# Patient Record
Sex: Male | Born: 1945 | Race: White | Hispanic: No | Marital: Married | Smoking: Never smoker
Health system: Southern US, Community
[De-identification: ages and names within clinical notes are randomized; demographics above are authoritative.]

## PROBLEM LIST (undated history)

## (undated) DIAGNOSIS — Z9289 Personal history of other medical treatment: Secondary | ICD-10-CM

## (undated) DIAGNOSIS — Z800 Family history of malignant neoplasm of digestive organs: Secondary | ICD-10-CM

## (undated) DIAGNOSIS — S329XXA Fracture of unspecified parts of lumbosacral spine and pelvis, initial encounter for closed fracture: Secondary | ICD-10-CM

## (undated) DIAGNOSIS — Z9889 Other specified postprocedural states: Secondary | ICD-10-CM

## (undated) DIAGNOSIS — I10 Essential (primary) hypertension: Secondary | ICD-10-CM

## (undated) DIAGNOSIS — S4290XA Fracture of unspecified shoulder girdle, part unspecified, initial encounter for closed fracture: Secondary | ICD-10-CM

## (undated) DIAGNOSIS — C61 Malignant neoplasm of prostate: Secondary | ICD-10-CM

## (undated) DIAGNOSIS — E786 Lipoprotein deficiency: Secondary | ICD-10-CM

## (undated) DIAGNOSIS — N509 Disorder of male genital organs, unspecified: Secondary | ICD-10-CM

## (undated) DIAGNOSIS — R972 Elevated prostate specific antigen [PSA]: Secondary | ICD-10-CM

## (undated) HISTORY — PX: PROSTATE BIOPSY: SHX241

## (undated) HISTORY — DX: Disorder of male genital organs, unspecified: N50.9

## (undated) HISTORY — DX: Family history of malignant neoplasm of digestive organs: Z80.0

## (undated) HISTORY — DX: Personal history of other medical treatment: Z92.89

## (undated) HISTORY — DX: Malignant neoplasm of prostate: C61

## (undated) HISTORY — DX: Other specified postprocedural states: Z98.890

## (undated) HISTORY — DX: Lipoprotein deficiency: E78.6

## (undated) HISTORY — PX: CARDIAC CATHETERIZATION: SHX172

## (undated) HISTORY — DX: Fracture of unspecified shoulder girdle, part unspecified, initial encounter for closed fracture: S42.90XA

## (undated) HISTORY — DX: Essential (primary) hypertension: I10

---

## 2000-05-19 DIAGNOSIS — S4290XA Fracture of unspecified shoulder girdle, part unspecified, initial encounter for closed fracture: Secondary | ICD-10-CM

## 2000-05-19 DIAGNOSIS — S329XXA Fracture of unspecified parts of lumbosacral spine and pelvis, initial encounter for closed fracture: Secondary | ICD-10-CM

## 2000-05-19 HISTORY — DX: Fracture of unspecified parts of lumbosacral spine and pelvis, initial encounter for closed fracture: S32.9XXA

## 2000-05-19 HISTORY — DX: Fracture of unspecified shoulder girdle, part unspecified, initial encounter for closed fracture: S42.90XA

## 2002-02-25 ENCOUNTER — Encounter: Payer: Self-pay | Admitting: Emergency Medicine

## 2002-02-25 ENCOUNTER — Emergency Department (HOSPITAL_COMMUNITY): Admission: EM | Admit: 2002-02-25 | Discharge: 2002-02-25 | Payer: Self-pay | Admitting: Emergency Medicine

## 2008-05-19 HISTORY — PX: MITRAL VALVE REPAIR: SHX2039

## 2009-03-22 ENCOUNTER — Ambulatory Visit (HOSPITAL_COMMUNITY): Admission: RE | Admit: 2009-03-22 | Discharge: 2009-03-22 | Payer: Self-pay | Admitting: Interventional Cardiology

## 2009-03-22 ENCOUNTER — Encounter (INDEPENDENT_AMBULATORY_CARE_PROVIDER_SITE_OTHER): Payer: Self-pay | Admitting: Interventional Cardiology

## 2009-03-23 ENCOUNTER — Ambulatory Visit: Payer: Self-pay | Admitting: Thoracic Surgery (Cardiothoracic Vascular Surgery)

## 2009-03-28 ENCOUNTER — Inpatient Hospital Stay (HOSPITAL_BASED_OUTPATIENT_CLINIC_OR_DEPARTMENT_OTHER): Admission: RE | Admit: 2009-03-28 | Discharge: 2009-03-28 | Payer: Self-pay | Admitting: Interventional Cardiology

## 2009-03-30 ENCOUNTER — Encounter: Payer: Self-pay | Admitting: Thoracic Surgery (Cardiothoracic Vascular Surgery)

## 2009-03-30 ENCOUNTER — Ambulatory Visit (HOSPITAL_COMMUNITY)
Admission: RE | Admit: 2009-03-30 | Discharge: 2009-03-30 | Payer: Self-pay | Admitting: Thoracic Surgery (Cardiothoracic Vascular Surgery)

## 2009-04-02 ENCOUNTER — Ambulatory Visit: Payer: Self-pay | Admitting: Thoracic Surgery (Cardiothoracic Vascular Surgery)

## 2009-04-05 ENCOUNTER — Ambulatory Visit: Payer: Self-pay | Admitting: Thoracic Surgery (Cardiothoracic Vascular Surgery)

## 2009-04-05 ENCOUNTER — Encounter: Payer: Self-pay | Admitting: Thoracic Surgery (Cardiothoracic Vascular Surgery)

## 2009-04-05 ENCOUNTER — Inpatient Hospital Stay (HOSPITAL_COMMUNITY)
Admission: RE | Admit: 2009-04-05 | Discharge: 2009-04-09 | Payer: Self-pay | Admitting: Thoracic Surgery (Cardiothoracic Vascular Surgery)

## 2009-04-16 ENCOUNTER — Ambulatory Visit: Payer: Self-pay | Admitting: Thoracic Surgery (Cardiothoracic Vascular Surgery)

## 2009-04-16 ENCOUNTER — Encounter
Admission: RE | Admit: 2009-04-16 | Discharge: 2009-04-16 | Payer: Self-pay | Admitting: Thoracic Surgery (Cardiothoracic Vascular Surgery)

## 2009-05-28 ENCOUNTER — Ambulatory Visit: Payer: Self-pay | Admitting: Thoracic Surgery (Cardiothoracic Vascular Surgery)

## 2009-08-20 ENCOUNTER — Ambulatory Visit: Payer: Self-pay | Admitting: Thoracic Surgery (Cardiothoracic Vascular Surgery)

## 2010-02-08 IMAGING — CR DG CHEST 1V PORT
1 series · 1 of 1 positions shown · non-contrast
Comparison: Chest 04/07/2009 at to 2651 hours.

CLINICAL DATA: Status post chest tube removal.

PORTABLE CHEST - 1 VIEW

[view not recorded]
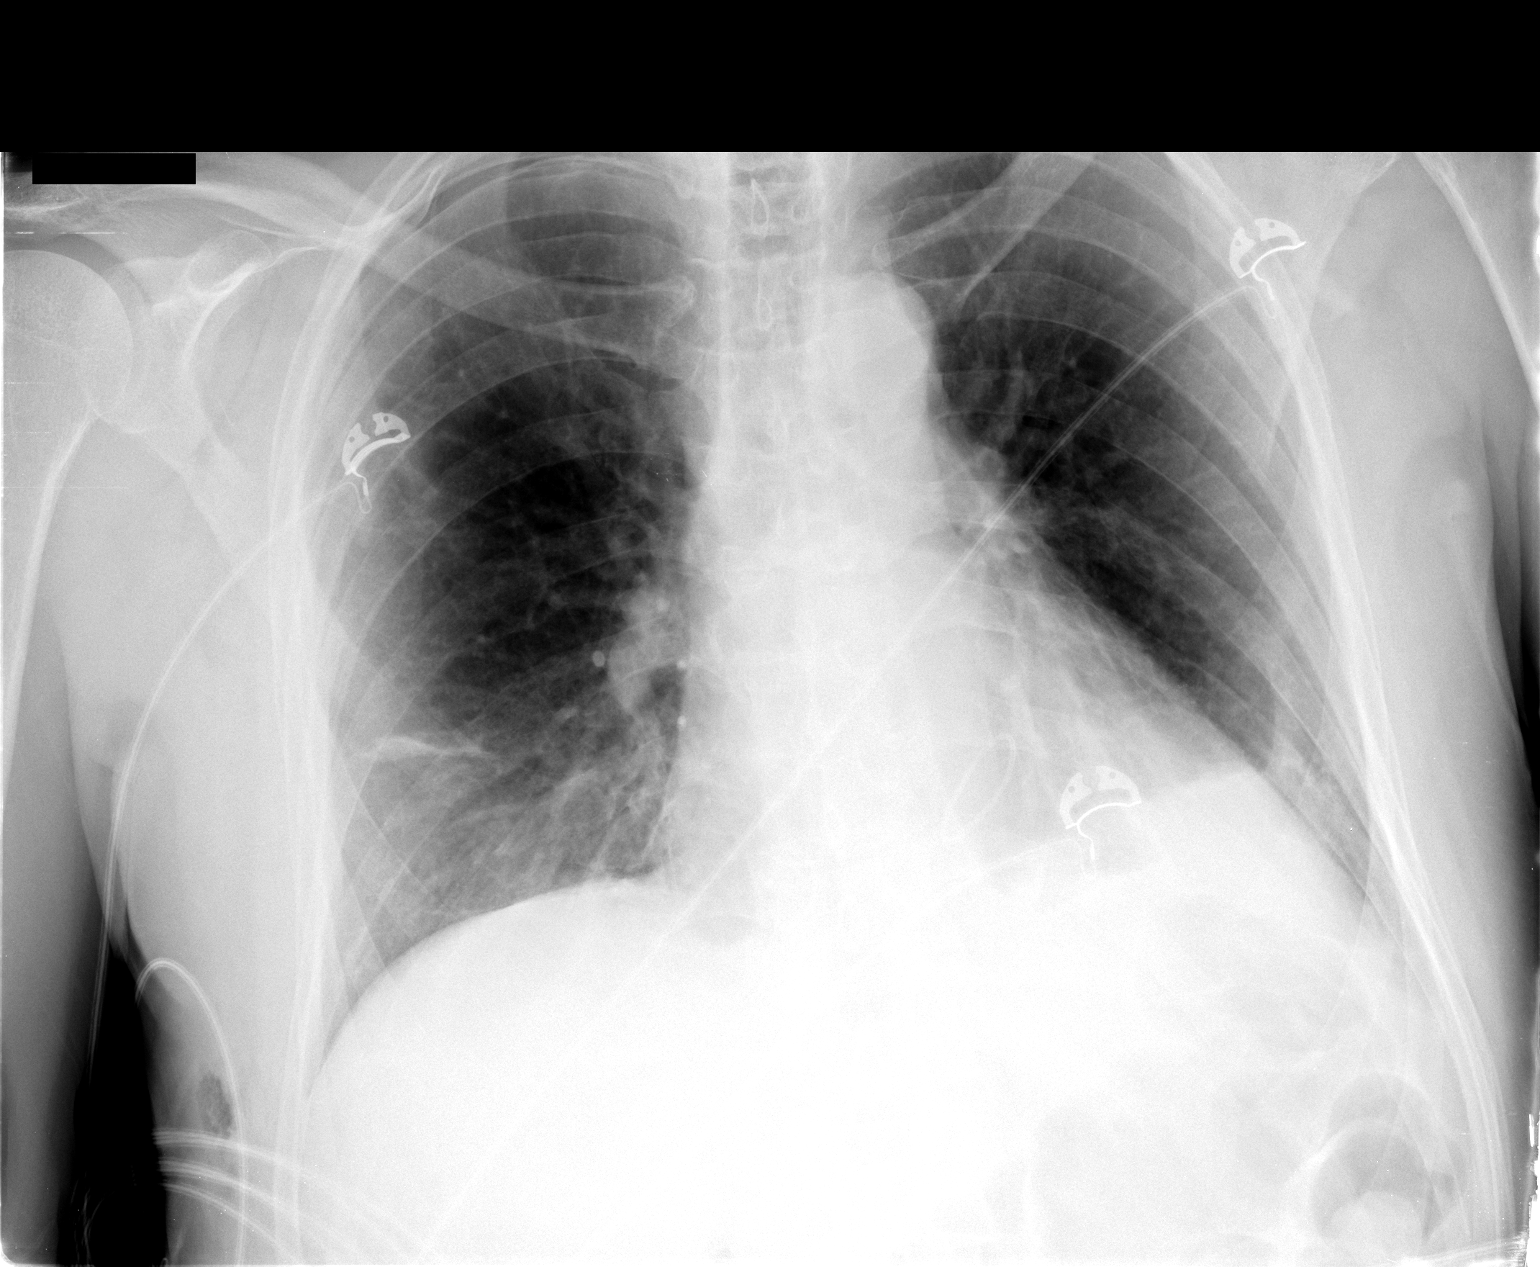

[1 of 1 positions shown; findings below may reference images not displayed]

FINDINGS: Right chest tube has been removed.  Right pneumothorax
seen on the prior study is slightly larger now estimated at 5-10%.
Examination is otherwise unchanged.
IMPRESSION: Some increase in small right pneumothorax after chest tube removal.
No other change.

## 2010-08-21 LAB — POCT I-STAT 3, VENOUS BLOOD GAS (G3P V)
Bicarbonate: 22.3 mEq/L (ref 20.0–24.0)
Bicarbonate: 26.8 mEq/L — ABNORMAL HIGH (ref 20.0–24.0)
O2 Saturation: 72 %
TCO2: 23 mmol/L (ref 0–100)
TCO2: 28 mmol/L (ref 0–100)
pCO2, Ven: 39.7 mmHg — ABNORMAL LOW (ref 45.0–50.0)
pH, Ven: 7.356 — ABNORMAL HIGH (ref 7.250–7.300)
pH, Ven: 7.403 — ABNORMAL HIGH (ref 7.250–7.300)

## 2010-08-21 LAB — PROTIME-INR
INR: 1.21 (ref 0.00–1.49)
INR: 1.39 (ref 0.00–1.49)
INR: 1.12 (ref 0.00–1.49)
Prothrombin Time: 15.2 seconds (ref 11.6–15.2)
Prothrombin Time: 17.4 seconds — ABNORMAL HIGH (ref 11.6–15.2)
Prothrombin Time: 14.3 seconds (ref 11.6–15.2)

## 2010-08-21 LAB — POCT I-STAT 4, (NA,K, GLUC, HGB,HCT)
Glucose, Bld: 107 mg/dL — ABNORMAL HIGH (ref 70–99)
Glucose, Bld: 108 mg/dL — ABNORMAL HIGH (ref 70–99)
Glucose, Bld: 119 mg/dL — ABNORMAL HIGH (ref 70–99)
Glucose, Bld: 92 mg/dL (ref 70–99)
Glucose, Bld: 95 mg/dL (ref 70–99)
HCT: 26 % — ABNORMAL LOW (ref 39.0–52.0)
HCT: 26 % — ABNORMAL LOW (ref 39.0–52.0)
HCT: 26 % — ABNORMAL LOW (ref 39.0–52.0)
HCT: 26 % — ABNORMAL LOW (ref 39.0–52.0)
HCT: 27 % — ABNORMAL LOW (ref 39.0–52.0)
HCT: 31 % — ABNORMAL LOW (ref 39.0–52.0)
HCT: 31 % — ABNORMAL LOW (ref 39.0–52.0)
HCT: 38 % — ABNORMAL LOW (ref 39.0–52.0)
Hemoglobin: 10.5 g/dL — ABNORMAL LOW (ref 13.0–17.0)
Hemoglobin: 10.5 g/dL — ABNORMAL LOW (ref 13.0–17.0)
Hemoglobin: 12.2 g/dL — ABNORMAL LOW (ref 13.0–17.0)
Hemoglobin: 12.9 g/dL — ABNORMAL LOW (ref 13.0–17.0)
Hemoglobin: 8.8 g/dL — ABNORMAL LOW (ref 13.0–17.0)
Hemoglobin: 8.8 g/dL — ABNORMAL LOW (ref 13.0–17.0)
Potassium: 4 mEq/L (ref 3.5–5.1)
Potassium: 4 mEq/L (ref 3.5–5.1)
Potassium: 5.1 mEq/L (ref 3.5–5.1)
Potassium: 5.7 mEq/L — ABNORMAL HIGH (ref 3.5–5.1)
Sodium: 133 mEq/L — ABNORMAL LOW (ref 135–145)
Sodium: 134 mEq/L — ABNORMAL LOW (ref 135–145)
Sodium: 135 mEq/L (ref 135–145)
Sodium: 139 mEq/L (ref 135–145)
Sodium: 139 mEq/L (ref 135–145)
Sodium: 142 mEq/L (ref 135–145)

## 2010-08-21 LAB — HEMOGLOBIN A1C
Hgb A1c MFr Bld: 5.4 % (ref 4.6–6.1)
Mean Plasma Glucose: 108 mg/dL

## 2010-08-21 LAB — CBC
HCT: 27.2 % — ABNORMAL LOW (ref 39.0–52.0)
HCT: 27.8 % — ABNORMAL LOW (ref 39.0–52.0)
HCT: 29.9 % — ABNORMAL LOW (ref 39.0–52.0)
HCT: 30.9 % — ABNORMAL LOW (ref 39.0–52.0)
HCT: 33 % — ABNORMAL LOW (ref 39.0–52.0)
HCT: 33.6 % — ABNORMAL LOW (ref 39.0–52.0)
Hemoglobin: 10.6 g/dL — ABNORMAL LOW (ref 13.0–17.0)
Hemoglobin: 11.3 g/dL — ABNORMAL LOW (ref 13.0–17.0)
Hemoglobin: 9.4 g/dL — ABNORMAL LOW (ref 13.0–17.0)
Hemoglobin: 14.3 g/dL (ref 13.0–17.0)
MCHC: 33.9 g/dL (ref 30.0–36.0)
MCHC: 34.6 g/dL (ref 30.0–36.0)
MCHC: 34.8 g/dL (ref 30.0–36.0)
MCHC: 34.3 g/dL (ref 30.0–36.0)
MCV: 95 fL (ref 78.0–100.0)
MCV: 95.6 fL (ref 78.0–100.0)
MCV: 95.6 fL (ref 78.0–100.0)
MCV: 96.8 fL (ref 78.0–100.0)
MCV: 96.8 fL (ref 78.0–100.0)
MCV: 95.9 fL (ref 78.0–100.0)
Platelets: 118 10*3/uL — ABNORMAL LOW (ref 150–400)
Platelets: 125 10*3/uL — ABNORMAL LOW (ref 150–400)
Platelets: 129 10*3/uL — ABNORMAL LOW (ref 150–400)
Platelets: 134 10*3/uL — ABNORMAL LOW (ref 150–400)
Platelets: 80 10*3/uL — ABNORMAL LOW (ref 150–400)
Platelets: 82 10*3/uL — ABNORMAL LOW (ref 150–400)
RBC: 2.87 MIL/uL — ABNORMAL LOW (ref 4.22–5.81)
RBC: 3.41 MIL/uL — ABNORMAL LOW (ref 4.22–5.81)
RBC: 4.33 MIL/uL (ref 4.22–5.81)
RDW: 13.2 % (ref 11.5–15.5)
RDW: 13.4 % (ref 11.5–15.5)
RDW: 13.6 % (ref 11.5–15.5)
RDW: 13.6 % (ref 11.5–15.5)
RDW: 13.1 % (ref 11.5–15.5)
WBC: 10 10*3/uL (ref 4.0–10.5)
WBC: 10.1 10*3/uL (ref 4.0–10.5)
WBC: 10.8 10*3/uL — ABNORMAL HIGH (ref 4.0–10.5)

## 2010-08-21 LAB — BASIC METABOLIC PANEL
BUN: 10 mg/dL (ref 6–23)
BUN: 11 mg/dL (ref 6–23)
BUN: 12 mg/dL (ref 6–23)
BUN: 16 mg/dL (ref 6–23)
CO2: 22 mEq/L (ref 19–32)
CO2: 24 mEq/L (ref 19–32)
Calcium: 8.2 mg/dL — ABNORMAL LOW (ref 8.4–10.5)
Calcium: 8.3 mg/dL — ABNORMAL LOW (ref 8.4–10.5)
Chloride: 100 mEq/L (ref 96–112)
Chloride: 106 mEq/L (ref 96–112)
Chloride: 111 mEq/L (ref 96–112)
Creatinine, Ser: 0.98 mg/dL (ref 0.4–1.5)
GFR calc Af Amer: >60 mL/min (ref >60)
GFR calc Af Amer: >60 mL/min (ref >60)
GFR calc non Af Amer: >60 mL/min (ref >60)
GFR calc non Af Amer: >60 mL/min (ref >60)
GFR calc non Af Amer: >60 mL/min (ref >60)
Glucose, Bld: 110 mg/dL — ABNORMAL HIGH (ref 70–99)
Glucose, Bld: 115 mg/dL — ABNORMAL HIGH (ref 70–99)
Glucose, Bld: 150 mg/dL — ABNORMAL HIGH (ref 70–99)
Potassium: 3.9 mEq/L (ref 3.5–5.1)
Potassium: 4 mEq/L (ref 3.5–5.1)
Potassium: 4 mEq/L (ref 3.5–5.1)
Potassium: 4.3 mEq/L (ref 3.5–5.1)
Sodium: 134 mEq/L — ABNORMAL LOW (ref 135–145)
Sodium: 137 mEq/L (ref 135–145)
Sodium: 139 mEq/L (ref 135–145)
Sodium: 140 mEq/L (ref 135–145)

## 2010-08-21 LAB — POCT I-STAT 3, ART BLOOD GAS (G3+)
Acid-base deficit: 2 mmol/L (ref 0.0–2.0)
Acid-base deficit: 3 mmol/L — ABNORMAL HIGH (ref 0.0–2.0)
Bicarbonate: 21.2 mEq/L (ref 20.0–24.0)
Bicarbonate: 21.9 mEq/L (ref 20.0–24.0)
Bicarbonate: 24.2 mEq/L — ABNORMAL HIGH (ref 20.0–24.0)
O2 Saturation: 100 %
O2 Saturation: 100 %
O2 Saturation: 97 %
O2 Saturation: 95 %
Patient temperature: 33.8
Patient temperature: 37.8
TCO2: 23 mmol/L (ref 0–100)
TCO2: 26 mmol/L (ref 0–100)
TCO2: 26 mmol/L (ref 0–100)
pCO2 arterial: 38.6 mmHg (ref 35.0–45.0)
pCO2 arterial: 41.1 mmHg (ref 35.0–45.0)
pCO2 arterial: 36.4 mmHg (ref 35.0–45.0)
pH, Arterial: 7.369 (ref 7.350–7.450)
pH, Arterial: 7.394 (ref 7.350–7.450)
pH, Arterial: 7.48 — ABNORMAL HIGH (ref 7.350–7.450)
pH, Arterial: 7.443 (ref 7.350–7.450)
pO2, Arterial: 77 mmHg — ABNORMAL LOW (ref 80.0–100.0)
pO2, Arterial: 72 mmHg — ABNORMAL LOW (ref 80.0–100.0)

## 2010-08-21 LAB — COMPREHENSIVE METABOLIC PANEL
BUN: 8 mg/dL (ref 6–23)
CO2: 22 mEq/L (ref 19–32)
Calcium: 9 mg/dL (ref 8.4–10.5)
Creatinine, Ser: 0.92 mg/dL (ref 0.4–1.5)
GFR calc non Af Amer: >60 mL/min (ref >60)
Glucose, Bld: 95 mg/dL (ref 70–99)

## 2010-08-21 LAB — BLOOD GAS, ARTERIAL
Acid-Base Excess: 0 mmol/L (ref 0.0–2.0)
Bicarbonate: 24 mEq/L (ref 20.0–24.0)
O2 Saturation: 97.2 %
Patient temperature: 98.6
pO2, Arterial: 85.2 mmHg (ref 80.0–100.0)

## 2010-08-21 LAB — URINALYSIS, ROUTINE W REFLEX MICROSCOPIC
Bilirubin Urine: NEGATIVE
Hgb urine dipstick: NEGATIVE
Ketones, ur: NEGATIVE mg/dL
Protein, ur: NEGATIVE mg/dL
Specific Gravity, Urine: 1.012 (ref 1.005–1.030)
Urobilinogen, UA: 0.2 mg/dL (ref 0.0–1.0)

## 2010-08-21 LAB — PREPARE FRESH FROZEN PLASMA

## 2010-08-21 LAB — POCT I-STAT, CHEM 8
BUN: 10 mg/dL (ref 6–23)
Calcium, Ion: 1.1 mmol/L — ABNORMAL LOW (ref 1.12–1.32)
Chloride: 108 mEq/L (ref 96–112)
Creatinine, Ser: 1 mg/dL (ref 0.4–1.5)
Glucose, Bld: 128 mg/dL — ABNORMAL HIGH (ref 70–99)

## 2010-08-21 LAB — PLATELET COUNT: Platelets: 103 10*3/uL — ABNORMAL LOW (ref 150–400)

## 2010-08-21 LAB — PREPARE PLATELETS

## 2010-08-21 LAB — GLUCOSE, CAPILLARY
Glucose-Capillary: 107 mg/dL — ABNORMAL HIGH (ref 70–99)
Glucose-Capillary: 118 mg/dL — ABNORMAL HIGH (ref 70–99)

## 2010-08-21 LAB — HEMOGLOBIN AND HEMATOCRIT, BLOOD: HCT: 27.2 % — ABNORMAL LOW (ref 39.0–52.0)

## 2010-08-21 LAB — CREATININE, SERUM: GFR calc non Af Amer: >60 mL/min (ref >60)

## 2010-08-21 LAB — TYPE AND SCREEN: Antibody Screen: NEGATIVE

## 2010-10-01 NOTE — Assessment & Plan Note (Signed)
OFFICE VISIT   ABRAHAM, ENTWISTLE T  DOB:  28-Jan-1946                                        August 20, 2009  CHART #:  [id number]   HISTORY:  The patient returns for routine followup, now more than 4  months status post right miniature thoracotomy for mitral valve repair.  He was last seen here in the office on May 28, 2009.  Since then, he  has done remarkably well.  He is back enjoying normal physical activity  and in fact has been doing a fair amount of strenuous yard work and  gardening over the last month or so.  He states that he has never had  any trouble at all and he has never had any shortness of breath.  He has  not had any significant residual pain in his chest and he feels quite  well.  He was seen in followup last month by Dr. Eldridge Dace and a repeat  echocardiogram was performed on July 25, 2009.  By report, this revealed  normal left ventricular systolic function with normal mitral valve  function and a good looking mitral repair with only trace residual  mitral regurgitation.  No other abnormalities were noted.  The patient  has no complaints.  The remainder of his review of systems is entirely  unremarkable.   CURRENT MEDICATIONS:  Aspirin, lisinopril, amlodipine, and Zyrtec.   PHYSICAL EXAMINATION:  Notable for a well-appearing male with blood  pressure 131/85, pulse 70 and regular, and oxygen saturation 98% on room  air.  Examination of the chest is notable for a right miniature  anterolateral thoracotomy incision that is healing nicely.  Auscultation  reveals clear breath sounds, which are symmetrical bilaterally.  No  wheezes, rales, or rhonchi are noted.  Cardiovascular exam demonstrates  regular rate and rhythm.  No murmurs, rubs, or gallops are appreciated.  The abdomen is soft and nontender.  The extremities are warm and well  perfused.  There is no lower extremity edema.  The remainder of physical  exam is unremarkable.   DIAGNOSTIC  TESTS:  Results of 2-D echocardiogram performed on July 25, 2009 is reviewed.  Results are as discussed previously with normal left  ventricular function and a good looking mitral valve repair.  The mitral  ring appears intact and there is only trace residual mitral  regurgitation.   IMPRESSION:  Excellent result following recent mitral valve repair.   PLAN:  In the future, the patient will call or return to see [place] as  needed.  All of his questions have been addressed.   Salvatore Decent. Cornelius Moras, M.D.  Electronically Signed   CHO/MEDQ  D:  08/20/2009  T:  08/21/2009  Job:  [id number]   cc:   Corky Crafts, MD  Dwana Curd. Para March, M.D.

## 2010-10-01 NOTE — Assessment & Plan Note (Signed)
OFFICE VISIT   Wayne Kaufman, Wayne Kaufman  DOB:  01-Sep-1945                                        May 28, 2009  CHART #:  [id number]   The patient returns for further routine followup status post right  miniature thoracotomy for mitral valve repair on April 05, 2009.  His  postoperative recovery has been uncomplicated.  He was last seen here in  the office on April 16, 2009.  Since then, he has continued to do  very well.  He states that he is now back to essentially normal activity  without any problems or complaints.  He has no shortness of breath  either with activity or at rest.  He has not had much of any problem  with pain or soreness in his chest.  He states that it feels a little  bit numb where he had his surgery and the cold weather makes it feel  funny, but otherwise it feels fine.  His appetite is good.  He is  sleeping well at night.  He hopes to continue to do more.  He has not  had any palpitations.  He has not had any dizzy spells.  The remainder  of his review of systems is unremarkable.   PHYSICAL EXAMINATION:  Notable for a well-appearing male with blood  pressure 150/89, pulse 78, and oxygen saturation 98% on room air.  Examination of the chest reveals a well-healed miniature anterolateral  thoracotomy incision.  Auscultation of the chest demonstrates clear  breath sounds, which are symmetrical.  Cardiovascular exam includes  regular rate and rhythm.  No murmurs, rubs, or gallops are noted.  The  right groin incision is healing nicely.  There is no lower extremity  edema.  The remainder of his physical exam is unremarkable.   IMPRESSION:  The patient is doing very well.   PLAN:  I think it is reasonable to go ahead and stop taking Coumadin at  this point and just go back to aspirin daily.  The patient will continue  to keep an eye on his blood pressure management.  He states that he has  been checking it routinely and typically his blood  pressure runs in the  130s, systolic.  At this point, I have encouraged him to continue to  gradually increase his physical activity as tolerated without any  specific limitations at this point in time.  All of his questions have  been addressed.  We will plan to see him back in 3 months for routine  followup.  At some point, a routine followup echocardiogram would be  reasonable to check LV function and the status of his repair.  We will  leave the timing of the echocardiogram up to Dr. Hoyle Barr discretion.   Salvatore Decent. Cornelius Moras, M.D.  Electronically Signed   CHO/MEDQ  D:  05/28/2009  Kaufman:  05/29/2009  Job:  [id number]   cc:   Corky Crafts, MD  Dwana Curd. Para March, M.D.

## 2010-10-01 NOTE — Assessment & Plan Note (Signed)
OFFICE VISIT   Wayne Kaufman, FRALIX T  DOB:  1945/09/14                                        April 02, 2009  CHART #:  [id number]   The patient returns to the office today for further follow up with  tentative plan to proceed with elective mitral valve repair.  Last week,  he underwent left and right heart catheterization by Dr. Eldridge Dace.  Findings at the time of catheterization confirmed the absence of any  significant coronary artery disease with normal coronary artery anatomy.  Pulmonary artery pressures measured 35/12 with pulmonary capillary wedge  pressure of 12 but a large V-wave consistent with severe mitral  regurgitation.  Baseline cardiac output was 3.1 L per minute by  thermodilution corresponding to a cardiac index of 1.5.  The descending  abdominal aorta and iliac vessels were essentially normal in appearance  and free of any significant atherosclerotic disease.  I have reviewed  the results of these tests with the patient and his wife here in the  office today.  We have again discussed the indications, risks, and  potential benefits of elective mitral valve repair.  All of their  questions have been addressed.   Salvatore Decent. Cornelius Moras, M.D.  Electronically Signed   CHO/MEDQ  D:  04/02/2009  T:  04/03/2009  Job:  [id number]   cc:   Corky Crafts, MD  Dwana Curd. Para March, M.D.

## 2010-10-01 NOTE — H&P (Signed)
HISTORY AND PHYSICAL EXAMINATION   March 23, 2009   Re:  LIPS, [place] T            DOB:  28-Jul-1945   REASON FOR CONSULTATION:  Severe mitral regurgitation.   HISTORY OF PRESENT ILLNESS:  The patient is a 65 year old gentleman who  is otherwise healthy, but recently discovered to have severe (4+) mitral  regurgitation.  The patient states that he was first told that he had a  heart murmur in 1967 when he was in the [employer].  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  He otherwise had not had any medical problems and has been  remarkably healthy all of his life.  He was seen by his primary care  physician, Dr. Para March at [facility] on [facility].  He  was noted to have a prominent heart murmur on routine physical exam and  subsequently referred to Dr. Eldridge Dace at [facility] for  consultation.  The patient underwent a 2-D echocardiogram on March 20, 2009.  This revealed moderate bileaflet prolapse of the mitral valve  with severe mitral regurgitation.  There was normal left ventricular  systolic function.  The left ventricle chamber size was slightly  enlarged.  No other significant abnormalities were noted.  Followup  transesophageal echocardiogram was performed yesterday by Dr. Eldridge Dace.  This confirmed the presence of severe (4+) mitral regurgitation with  what appears to be a flail posterior leaflet of the mitral valve with  obvious ruptured chordae tendineae.  There is normal left ventricular  function.  No other significant abnormalities were noted.  The patient  was referred for possible elective mitral valve repair.   REVIEW OF SYSTEMS:  GENERAL:  The patient reports feeling quite well.  He has normal appetite.  Has not been gaining nor losing weight  recently.  VITAL SIGNS:  He is 6 feet tall and weighs approximately 175 pounds.  CARDIAC:  The patient specifically denies  any history of chest pain,  chest pressure, chest discomfort either with activity or at rest.  The  patient also specifically denies any problems with shortness of breath  either with activity or at rest.  He denies PND, orthopnea, or lower  extremity edema.  He has not had palpitations or syncope.  RESPIRATORY:  Negative.  The patient denies productive cough,  hemoptysis, or wheezing.  GASTROINTESTINAL:  Negative.  The patient has no difficulty swallowing.  He denies hematochezia, hematemesis, or melena.  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  These symptoms are stable.  The patient  otherwise has no physical limitations at all.  NEUROLOGIC:  Negative.  The patient denies symptoms suggestive of TIA or  stroke.  HEENT:  Negative.  The patient sees his dentist on a regular basis and  has had no problems whatsoever.   PAST MEDICAL HISTORY:  1. Mitral valve prolapse with mitral regurgitation.  2. Traumatic fall in 2002 resulting in fracture of left shoulder and      left pelvis.   PAST SURGICAL HISTORY:  None.   FAMILY HISTORY:  Noncontributory.   SOCIAL HISTORY:  The patient is married and lives with his wife here in  [place].  He is a retired Art gallery manager having previously worked for  [employer].  More recently, he worked  for [employer].  He has been retired for the last couple of years.  He remains  reasonably active physically.  Although, he does not take part in the  routine strenuous physical activity.  He is a nonsmoker.  He denies  alcohol consumption.   CURRENT MEDICATIONS:  Aspirin 81 mg enteric coated daily and immune  enhance over-the-counter supplement.   DRUG ALLERGIES:  Hydrocodone causes hallucination.   PHYSICAL EXAMINATION:  General:  The patient is a well-appearing, thin  white male who appears his stated age in  no acute distress.  Vital  Signs:  Blood pressure 180/86, pulse 56 and regular, and oxygen  saturation 97% on room air.  HEENT:  Unrevealing.  Neck:  Supple.  There  is no cervical or supraclavicular lymphadenopathy.  There is no jugular  venous distention.  No carotid bruits are noted.  Chest:  Auscultation  of the chest demonstrates clear breath sounds which are symmetrical  bilaterally.  No wheezes or rhonchi are demonstrated.  Cardiovascular:  Notable for regular rate and rhythm.  There is a prominent grade 4/6  holosystolic murmur heard best at the apex with radiation all across the  entire precordium to the axilla into the back.  No diastolic murmurs  noted.  Abdomen:  Soft, nondistended, and nontender.  Bowel sounds are  present.  Extremities:  Warm and well perfused.  There is no lower  extremity edema.  Femoral pulses and distal pulses are easily palpable  on both sides.  There is no lower extremity edema.  There is no venous  insufficiency.  Rectal and GU:  Both deferred.  Neurologic:  Grossly  nonfocal and symmetrical throughout.   DIAGNOSTIC TESTS:  Transesophageal echocardiogram performed by Dr.  Eldridge Dace yesterday is reviewed.  This demonstrates mitral valve prolapse  with a flail middle scallop (P2) of the posterior leaflet and severe  (4+) mitral regurgitation.  The patient has some bileaflet prolapse, but  primarily just a flail segment of the middle scallop of the posterior  leaflet.  There is an eccentric jet of regurgitation that courses along  the atrial surface of the anterior leaflet of the mitral valve and  around the entire left atrium consistent with severe mitral  regurgitation.  There is normal left ventricular systolic function.  The  aortic valve appears normal.  No other abnormalities noted.   IMPRESSION:  Mitral valve prolapse with severe mitral regurgitation and  a flail segment of the posterior leaflet of the mitral valve.  The  patient remains  asymptomatic and is otherwise quite healthy.  I agree  that he would best be treated with elective mitral valve repair, and  based upon his transesophageal echocardiogram, I feel there is a very  high likelihood of successful valve repair with extraordinary low  potential for need to replace the valve (greater than 95% likelihood of  repair).  I suspect he will be an excellent candidate for minimally  invasive approach, although cardiac catheterization is yet to be  performed.   PLAN:  I have discussed options at length with the patient and his wife  here in the office today.  We discussed the rationale for proceeding  with surgery despite the fact that he remains asymptomatic.  We  discussed the alternative of continued close followup with medical  therapy.  All of his questions have been addressed.  We will tentatively  plan to proceed with surgery on Tuesday, April 03, 2009.  The patient  hopes to  have left and right heart catheterization performed sometime  next week by Dr. Eldridge Dace.  All of his questions have been addressed.  I  have given him a prescription for amiodarone to begin 1 week prior to  surgery in an effort to decrease the likelihood of perioperative  arrhythmias.   Salvatore Decent. Cornelius Moras, M.D.  Electronically Signed   CHO/MEDQ  D:  03/23/2009  T:  03/24/2009  Job:  [id number]   cc:   Corky Crafts, MD  Dwana Curd. Para March, M.D.

## 2010-10-01 NOTE — Assessment & Plan Note (Signed)
OFFICE VISIT   Wayne, Kaufman T  DOB:  1946/05/12                                        April 16, 2009  CHART #:  [id number]   The patient returns to the office today for routine followup status post  right miniature thoracotomy for mitral valve repair on April 05, 2009.  His postoperative recovery has been uneventful.  Following  hospital discharge, he has continued to do very well.  He reports  minimal pain in his chest and reports that really the only site that he  has had any discomfort is the site where his 2 chest tubes were  inferiorly.  He has some mild numbness along the right mini thoracotomy  incision, but essentially no pain in this region.  He otherwise has no  pain at all.  He has not required any pain relievers other than oral  Tylenol.  He has had no shortness of breath.  He denies any tachy  palpitations.  He has not had dizzy spells.  Overall, he feels  remarkably well and has no complaints at all.  His prothrombin time was  checked last week at Dr. Hoyle Barr office, and he is scheduled to go  back later today for a followup INR.  His medications remain unchanged  from the time of hospital discharge.   PHYSICAL EXAMINATION:  Notable for a well-appearing male with blood  pressure 176/84, pulse 68 and regular, and oxygen saturation 99% on room  air.  Examination of the chest reveals a mini thoracotomy incision that  is healing nicely.  Auscultation reveals clear breath sounds, which are  symmetrical bilaterally.  No wheezes or rhonchi are noted.  Cardiovascular exam demonstrates regular rate and rhythm.  No murmurs,  rubs, or gallops are appreciated.  The abdomen is soft and nontender.  The extremities are warm and well perfused.  There is no lower extremity  edema.  The right groin incision is healing nicely and there is no  swelling or cellulitis in this region.  No other abnormalities are  noted.   DIAGNOSTIC TEST:  Chest x-ray  performed today at the [facility] is reviewed.  This demonstrates clear lung fields bilaterally  with no pleural effusions.  No other abnormalities are noted.   IMPRESSION:  The patient is doing exceptionally well.  His blood  pressure is slightly elevated here in the office today and seems to run  high in general.  As a result, I have instructed him to increase his  dose of lisinopril to 20 mg daily.  The patient will continue to have  his prothrombin time checked through Dr. Hoyle Barr office, and he plans  to see Dr. Eldridge Dace for routine followup within the next couple weeks.  We will plan to see him back in 6 weeks' time.  If he continues to do  well at that point, we can probably stop the Coumadin.   Salvatore Decent. Cornelius Moras, M.D.  Electronically Signed   CHO/MEDQ  D:  04/16/2009  T:  04/17/2009  Job:  [id number]   cc:   Corky Crafts, MD  Dwana Curd. Para March, M.D.

## 2013-04-11 ENCOUNTER — Telehealth: Payer: Self-pay | Admitting: Interventional Cardiology

## 2013-04-11 MED ORDER — AMLODIPINE BESYLATE 2.5 MG PO TABS: 2.5000 mg | ORAL_TABLET | Freq: Every day | ORAL | Status: DC

## 2013-04-11 NOTE — Telephone Encounter (Signed)
Pt notified to continue Amlodipine 2.5 mg daily and refill sent into pharmacy.

## 2013-04-11 NOTE — Telephone Encounter (Signed)
New message  Patient has questions regarding medication, please call and advise. The medication is Amlodipine Veslate 5mg . He's only been taking 2.5mg , does he needs to continue that?

## 2014-02-05 ENCOUNTER — Other Ambulatory Visit: Payer: Self-pay | Admitting: Interventional Cardiology

## 2014-02-06 ENCOUNTER — Other Ambulatory Visit: Payer: Self-pay

## 2014-02-06 ENCOUNTER — Telehealth: Payer: Self-pay

## 2014-02-06 MED ORDER — LOSARTAN POTASSIUM 100 MG PO TABS: ORAL_TABLET | ORAL | Status: DC

## 2014-02-06 MED ORDER — LOSARTAN POTASSIUM 100 MG PO TABS: 50.0000 mg | ORAL_TABLET | Freq: Every day | ORAL | Status: DC

## 2014-02-06 NOTE — Telephone Encounter (Signed)
Ok to refill losartan 100 mg 1 tablet qd for a few months.

## 2014-02-07 NOTE — Telephone Encounter (Signed)
Ok to refill 

## 2014-02-13 ENCOUNTER — Encounter: Payer: Self-pay | Admitting: Cardiology

## 2014-02-13 DIAGNOSIS — I059 Rheumatic mitral valve disease, unspecified: Secondary | ICD-10-CM | POA: Insufficient documentation

## 2014-02-13 DIAGNOSIS — I10 Essential (primary) hypertension: Secondary | ICD-10-CM | POA: Insufficient documentation

## 2014-02-17 ENCOUNTER — Encounter: Payer: Self-pay | Admitting: Interventional Cardiology

## 2014-02-17 ENCOUNTER — Ambulatory Visit (INDEPENDENT_AMBULATORY_CARE_PROVIDER_SITE_OTHER): Payer: No Typology Code available for payment source | Admitting: Interventional Cardiology

## 2014-02-17 VITALS — BP 158/85 | HR 56 | Ht 72.0 in | Wt 178.0 lb

## 2014-02-17 DIAGNOSIS — I10 Essential (primary) hypertension: Secondary | ICD-10-CM

## 2014-02-17 DIAGNOSIS — I059 Rheumatic mitral valve disease, unspecified: Secondary | ICD-10-CM

## 2014-02-17 NOTE — Patient Instructions (Signed)
Your physician recommends that you continue on your current medications as directed. Please refer to the Current Medication list given to you today.  Your physician wants you to follow-up in: 1 year with Dr. Varanasi. You will receive a reminder letter in the mail two months in advance. If you don't receive a letter, please call our office to schedule the follow-up appointment.  

## 2014-02-17 NOTE — Progress Notes (Signed)
[name], male   DOB: 05/25/1945, 68 y.o.   MRN: [id number]    [place], [address] [place], [place]  [address] Phone: [phone] Fax:  [phone]  Date:  02/17/2014   ID:  BRAZEN DOMANGUE, DOB 14-Feb-1946, MRN [id number]  PCP:  Gennette Pac, MD      History of Present Illness: Wayne Kaufman is a 68 y.o. male who had mitral valve repair in 03/2009. He has done well. He has no shortness of breath or angina. He did not have significant coronary artery disease at that time. 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 He did have a cough. He thinks it is related to his lisinopril. Switched to losartan. He also has had dry eyes, but this is better. He saw ophthalmologist but no solution was obtained. He was concerned that he had a reaction to amoxicillin. He has had flu like sx after his dental cleaning. They resolve by the next day. No rash. No throat swelling.  He feels that he is feeling some mild decrease in thinking and he attributes it to the amlodipine. His mood is a little depressed. It resolved when he stopped losartan for a few weeks. He restarted the medicine and his sx came back. He stopped again and sx resolved.  Would not use high dose amlodipine in the future.  BP at home is better    Wt Readings from Last 3 Encounters:  02/17/14 178 lb (80.74 kg)     Past Medical History  Diagnosis Date  . Broken shoulder 2002    and broken left pelvis  . S/P mitral valve repair   . History of echocardiogram     2011 showed trace mitral regurg, mitral annuloplasty ring noted, EF 50-55%. Need SBE prophylaxis   . Family history of colon cancer   . 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 Essential hypertension   . Low HDL (under 40)     Current Outpatient Prescriptions  Medication Sig Dispense Refill  . amLODipine (NORVASC) 2.5 MG tablet Take 1 tablet (2.5 mg total) by mouth daily.  90 tablet  3  .  aspirin 81 MG tablet Take 81 mg by mouth daily.      . Ciprofloxacin (CIPRO PO) Take 50 mg by mouth as needed. For dentist      . losartan (COZAAR) 100 MG tablet TAKE ONE TABLET BY MOUTH ONCE DAILY  90 tablet  0  . Multiple Vitamin (MULTIVITAMIN) tablet Take 1 tablet by mouth daily.       No current facility-administered medications for this visit.    Allergies:    Allergies  Allergen Reactions  . Amoxicillin Other (See Comments)    Flu like symptoms  . Codeine Other (See Comments)    unknown    Social History:  The patient  reports that he has never smoked. He does not have any smokeless tobacco history on file.   Family History:  The patient's family history includes Colon cancer in his father; Congenital heart disease in his sister; Heart failure in his mother. There is no history of Heart attack or Stroke.   ROS:  Please see the history of present illness.  No nausea, vomiting.  No fevers, chills.  No focal weakness.  No dysuria.    All other systems reviewed and negative.   PHYSICAL EXAM: VS:  BP 158/85  Pulse  56  Ht 6' (1.829 m)  Wt 178 lb (80.74 kg)  BMI 24.14 kg/m2 Well nourished, well developed, in no acute distress HEENT: normal Neck: no JVD, no carotid bruits Cardiac:  normal S1, S2; RRR;  Lungs:  clear to auscultation bilaterally, no wheezing, rhonchi or rales Abd: soft, nontender, no hepatomegaly Ext: no edema Skin: warm and dry Neuro:   no focal abnormalities noted  EKG:       ASSESSMENT AND PLAN:  Mitral Regurgitation  IMAGING: EKG    Stegall,Amy 02/08/2013 09:43:33 AM > Eudelia Hiltunen,JAY 02/08/2013 10:15:53 AM > Sinus bradycardia, no ST segment changes   Notes: Status post mitral valve repair. Continue SBE prophylaxis. No murmur on exam. Valve repair appears to be functioning well. No need for echocardiogram at this time. Now on clindmycin due to possible reaction to amoxicillin.    2. Essential hypertension  Continue Losartan Potassium Tablet, 100 MG, 1  tablet, Orally, Once a day, 90 days, 90, Refills 3 Decrease Amlodipine Besylate Tablet, 2.5 MG, 1 tablet, Orally, Once a day, 90 days, 90, Refills 3 Notes: Cough resolved with ARB.    Preventive Medicine  Adult topics discussed:  Exercise: 5 days a week, at least 30 minutes of aerobic exercise.      Signed, Mina Marble, MD, [facility] 02/17/2014 12:01 PM

## 2014-04-05 ENCOUNTER — Other Ambulatory Visit: Payer: Self-pay | Admitting: Interventional Cardiology

## 2014-05-15 ENCOUNTER — Other Ambulatory Visit: Payer: Self-pay | Admitting: Interventional Cardiology

## 2014-05-16 ENCOUNTER — Other Ambulatory Visit: Payer: Self-pay | Admitting: Interventional Cardiology

## 2014-05-17 ENCOUNTER — Other Ambulatory Visit: Payer: Self-pay | Admitting: *Deleted

## 2014-05-22 ENCOUNTER — Telehealth: Payer: Self-pay | Admitting: Medical Oncology

## 2014-05-22 NOTE — Telephone Encounter (Signed)
Pt returned my call regarding his referral to the [facility] [place]. I introduced myself as the Prostate Nurse Navigator and the Coordinator of the [facility] [place].  1. I confirmed with the patient he is aware of his referral to the clinic by Dr. Alinda Money.  2. I discussed the format of the clinic and the physicians he will be seeing that day.  3. I discussed where the clinic is located and how to contact me.  4. I confirmed his address and informed him I would be mailing a packet of information and forms to be completed. I asked him to bring them with him the day of his appointment.   He voiced understanding of the above. I asked him to call me if he has any questions or concerns regarding his appointments or the forms he needs to complete.   Cira Rue, RN, BSN, [place]  [phone]  Fax [phone]

## 2014-05-22 NOTE — Telephone Encounter (Signed)
I called pt and left a message asking him to call me back to discuss the referral to the [facility] [place].

## 2014-05-24 ENCOUNTER — Encounter: Payer: Self-pay | Admitting: Radiation Oncology

## 2014-05-24 NOTE — Progress Notes (Signed)
GU Location of Tumor / Histology: prostatic adenocarcinoma  If Prostate Cancer, Gleason Score is (4 + 3) and PSA is (2.66)  Wayne Kaufman referred to Dr. Alinda Money by Dr. Hulan Fess for an elevated PSA.  Biopsies of prostate (if applicable) revealed:    Past/Anticipated interventions by urology, if any: prostate biopsy and referral to Dr. Tammi Klippel  Past/Anticipated interventions by medical oncology, if any: no  Weight changes, if any: no  Bowel/Bladder complaints, if any: denies significant bothersome urinary symptoms   Nausea/Vomiting, if any: no  Pain issues, if any:  no  SAFETY ISSUES:  Prior radiation? no  Pacemaker/ICD? no  Possible current pregnancy? no  Is the patient on methotrexate? no  Current Complaints / other details:  69 year old male. Married. Retired Chief Financial Officer. Prostate volume 19.5 cc.   July 2014 PSA     1.59 August 2015 PSA    3.55 September 2015 PSA s/p antibiotics  2.66

## 2014-05-25 ENCOUNTER — Telehealth: Payer: Self-pay | Admitting: Medical Oncology

## 2014-05-25 DIAGNOSIS — C61 Malignant neoplasm of prostate: Secondary | ICD-10-CM | POA: Insufficient documentation

## 2014-05-25 NOTE — Telephone Encounter (Signed)
I called Mr. Hackler to confirm his appointments for Prostate MDC tomorrow. I spoke with his wife [name] and she confirmed the time of 8:00am. I also reminded her that he needs to bring his completed forms to the visit.  She voiced understanding. I asked them to call if any questions or concerns.   Cira Rue, RN, BSN, CRNI Prostate Nurse [place] Office: [phone] Fax: [phone]

## 2014-05-25 NOTE — Progress Notes (Addendum)
Radiation Oncology         [phone] ________________________________  Multidisciplinary Prostate Cancer Clinic  Initial Radiation Oncology Consultation  Name: Wayne Kaufman  MRN: [id number]  Date: 05/26/2014  DOB: 08-Mar-1946  KV:[id number],LOVFI Marigene Ehlers, MD  Raynelle Bring, MD   REFERRING PHYSICIAN: Raynelle Bring, MD  DIAGNOSIS: 69 y.o. 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 Malignant neoplasm of prostate 185 C61     HISTORY OF PRESENT ILLNESS::Wayne Kaufman is a 69 y.o. gentleman.  He was noted to have a rising PSA of 3.55 in August 2015 by his primary care physician, Dr. Rex Kras.  A short interval follow-up in September remained higher than his previous baseline.  Accordingly, he was referred for evaluation in urology by Dr. Alinda Money on 04/07/14,  digital rectal examination was performed at that time revealing a palpable nodule involving the right apex which appeared confined to the capsule.  The patient proceeded to transrectal ultrasound with 12 biopsies of the prostate on 05/05/14.  The prostate volume measured 19.5 cc.  Out of 12 core biopsies, 3 were positive.  The maximum Gleason score was 4+3, and this was seen in right gland as shown below:  The patient reviewed the biopsy results with his urologist and he has kindly been referred today to the multidisciplinary prostate cancer clinic for presentation of pathology and radiology studies in our conference for discussion of potential radiation treatment options and clinical evaluation.  PREVIOUS RADIATION THERAPY: No  PAST MEDICAL HISTORY:  has a past medical history of Broken shoulder (2002); S/P mitral valve repair; History of echocardiogram; Family history of colon cancer; Testicle trouble; Essential hypertension; Low HDL (under 40); Prostate cancer; and Fractured pelvis (2002).    PAST SURGICAL HISTORY: Past Surgical History  Procedure  Laterality Date  . Cardiac catheterization    . Prostate biopsy    . Mitral valve repair  2010    FAMILY HISTORY: family history includes Cancer in his father and mother; Colon cancer in his father; Congenital heart disease in his sister; Heart failure in his mother. There is no history of Heart attack or Stroke.  SOCIAL HISTORY:  reports that he has never smoked. He has never used smokeless tobacco. He reports that he does not drink alcohol or use illicit drugs.  ALLERGIES: Amoxicillin and Codeine  MEDICATIONS:  Current Outpatient Prescriptions  Medication Sig Dispense Refill  . amLODipine (NORVASC) 2.5 MG tablet TAKE 1 TABLET (2.5 MG TOTAL) BY MOUTH DAILY. 90 tablet 3  . aspirin 81 MG tablet Take 81 mg by mouth daily.    Marland Kitchen losartan (COZAAR) 100 MG tablet TAKE ONE TABLET BY MOUTH ONCE DAILY 90 tablet 1  . Multiple Vitamin (MULTIVITAMIN) tablet Take 1 tablet by mouth daily.     No current facility-administered medications for this encounter.    REVIEW OF SYSTEMS:  A 15 point review of systems is documented in the electronic medical record. This was obtained by the nursing staff. However, I reviewed this with the patient to discuss relevant findings and make appropriate changes.  A comprehensive review of systems was negative..  The patient completed an IPSS and IIEF questionnaire.  His IPSS score was 4 indicating mild urinary outflow obstructive symptoms.  He indicated that his erectile function is able to complete sexual activity almost always.   PHYSICAL EXAM: This patient is in no acute distress.  He is  alert and oriented.   height is 5\' 11"  (1.803 m) and weight is 182 lb 1.6 oz (82.6 kg). His oral temperature is 98 F (36.7 C). His blood pressure is 140/81 and his pulse is 67. His respiration is 16 and oxygen saturation is 100%.  He exhibits no respiratory distress or labored breathing.  He appears neurologically intact.  His mood is pleasant.  His affect is appropriate.  Please note  the digital rectal exam findings described above.  KPS = 100  100 - Normal; no complaints; no evidence of disease. 90   - Able to carry on normal activity; minor signs or symptoms of disease. 80   - Normal activity with effort; some signs or symptoms of disease. 68   - Cares for self; unable to carry on normal activity or to do active work. 60   - Requires occasional assistance, but is able to care for most of his personal needs. 50   - Requires considerable assistance and frequent medical care. 70   - Disabled; requires special care and assistance. 63   - Severely disabled; hospital admission is indicated although death not imminent. 48   - Very sick; hospital admission necessary; active supportive treatment necessary. 10   - Moribund; fatal processes progressing rapidly. 0     - Dead  Karnofsky DA, Abelmann [place], Craver LS and Burchenal [facility] [phone]) The use of the nitrogen mustards in the palliative treatment of carcinoma: with particular reference to bronchogenic carcinoma Cancer 1 [id number]   LABORATORY DATA:  Lab Results  Component Value Date   WBC 10.0 04/09/2009   HGB 11.3 DELTA CHECK NOTED POST TRANSFUSION SPECIMEN* 04/09/2009   HCT 33.0* 04/09/2009   MCV 96.8 04/09/2009   PLT 118 DELTA CHECK NOTED REPEATED TO VERIFY* 04/09/2009   Lab Results  Component Value Date   NA 134* 04/09/2009   K 3.9 04/09/2009   CL 100 04/09/2009   CO2 26 04/09/2009   Lab Results  Component Value Date   ALT 16 03/30/2009   AST 21 03/30/2009   ALKPHOS 63 03/30/2009   BILITOT 1.0 03/30/2009     RADIOGRAPHY: No results found.    IMPRESSION: This gentleman is a 69 y.o. 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  His Gleason's Score puts him into the high-intermediate risk group.  Accordingly he is eligible for a variety of potential treatment options including radical prostatectomy or external beam radiotherapy with or without seed  implant boost.  This patient may benefit from further staging studies including bone scan and pelvic CT area  PLAN:Today I reviewed the findings and workup thus far.  We discussed the natural history of prostate cancer.  We reviewed the the implications of T-stage, Gleason's Score, and PSA on decision-making and outcomes related to prostate cancer.  We discussed radiation treatment in the management of prostate cancer with regard to the logistics and delivery of external beam radiation treatment as well as the logistics and delivery of prostate brachytherapy as a boost.  We compared and contrasted each of these approaches and also compared these against prostatectomy.    The patient did not finalize his decision today. However, he was putting serious consideration towards external beam radiotherapy. I enjoyed meeting with him today, and will look forward to participating in the care of this very nice gentleman.  I will look forward to following his progress, and reviewing the outcomes of his upcoming pelvic  CT and bone scan. He will be scheduled to follow-up with urology, and I will be happy to see him if he elects to proceed with external beam radiotherapy.   I spent 40 minutes minutes face to face with the patient and more than 50% of that time was spent in counseling and/or coordination of care.    ------------------------------------------------  Sheral Apley. Tammi Klippel, M.D.

## 2014-05-26 ENCOUNTER — Encounter: Payer: Self-pay | Admitting: *Deleted

## 2014-05-26 ENCOUNTER — Ambulatory Visit
Admission: RE | Admit: 2014-05-26 | Discharge: 2014-05-26 | Disposition: A | Discharge status: Home / Self Care | Payer: Medicare Other | Source: Ambulatory Visit | Attending: Radiation Oncology | Admitting: Radiation Oncology

## 2014-05-26 ENCOUNTER — Other Ambulatory Visit (HOSPITAL_COMMUNITY): Payer: Self-pay | Admitting: Urology

## 2014-05-26 ENCOUNTER — Encounter: Payer: Self-pay | Admitting: Medical Oncology

## 2014-05-26 ENCOUNTER — Encounter: Payer: Self-pay | Admitting: Radiation Oncology

## 2014-05-26 ENCOUNTER — Ambulatory Visit (HOSPITAL_BASED_OUTPATIENT_CLINIC_OR_DEPARTMENT_OTHER): Payer: 59 | Admitting: Oncology

## 2014-05-26 VITALS — BP 140/81 | HR 67 | Temp 98.0°F | Resp 16 | Ht 71.0 in | Wt 182.1 lb

## 2014-05-26 DIAGNOSIS — C61 Malignant neoplasm of prostate: Secondary | ICD-10-CM

## 2014-05-26 DIAGNOSIS — I10 Essential (primary) hypertension: Secondary | ICD-10-CM | POA: Diagnosis not present

## 2014-05-26 DIAGNOSIS — Z7982 Long term (current) use of aspirin: Secondary | ICD-10-CM | POA: Diagnosis not present

## 2014-05-26 HISTORY — DX: Fracture of unspecified parts of lumbosacral spine and pelvis, initial encounter for closed fracture: S32.9XXA

## 2014-05-26 NOTE — Consult Note (Signed)
Reason for Referral: Prostate cancer.   HPI: 69 year old gentleman native of [place] where he lived the majority of his life. He is a rather healthy gentleman with history of hypertension and really no significant comorbid conditions. She presented with an elevated PSA up to 2.66 after an increase in the last year from 1.59. He was referred to Dr. Alinda Money and felt that he probably has a right apical mass on his digital rectal examination. Based on these findings he underwent a prostate biopsy on 10 2015. The biopsy showed a Gleason score 4+3 = 7 in 2 cores of the right base as well as a Gleason score 3+4 = 7 in one core. The patient however is completely asymptomatic. He does not report any nocturia or dysuria. He does not report any frequency, urgency or hesitancy. His IPSS score is 5 without any erectile dysfunction. He did not report any headaches or blurry vision or syncope. He does not report any chest pain, palpitation orthopnea. Does not report any cough or hemoptysis or hematemesis. Does not report any nausea, vomiting or abdominal pain. Does not report any skeletal complaints. She did have trauma in 2002 for his hip and pelvis that is well healed at this time. Rest of his review of systems unremarkable.   Past Medical History  Diagnosis Date  . Broken shoulder 2002    and broken left pelvis  . S/P mitral valve repair   . History of echocardiogram     2011 showed trace mitral regurg, mitral annuloplasty ring noted, EF 50-55%. Need SBE prophylaxis   . Family history of colon cancer   . 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 Essential hypertension   . Low HDL (under 40)   . Prostate cancer   . Fractured pelvis 2002    fx shoulder and pelvis at the same time  :  Past Surgical History  Procedure Laterality Date  . Cardiac catheterization    . Prostate biopsy    . Mitral valve repair  2010  :  Current outpatient prescriptions:  amLODipine (NORVASC) 2.5 MG tablet, TAKE 1 TABLET (2.5 MG TOTAL) BY MOUTH DAILY., Disp: 90 tablet, Rfl: 3;  aspirin 81 MG tablet, Take 81 mg by mouth daily., Disp: , Rfl: ;  losartan (COZAAR) 100 MG tablet, TAKE ONE TABLET BY MOUTH ONCE DAILY, Disp: 90 tablet, Rfl: 1;  Multiple Vitamin (MULTIVITAMIN) tablet, Take 1 tablet by mouth daily., Disp: , Rfl: :  Allergies  Allergen Reactions  . Amoxicillin Other (See Comments)    Flu like symptoms  . Codeine Other (See Comments)    unknown  :  Family History  Problem Relation Age of Onset  . Heart failure Mother   . Cancer Mother     breast  . Colon cancer Father   . Cancer Father     colon  . Congenital heart disease Sister   . Heart attack Neg Hx   . Stroke Neg Hx   :  History   Social History  . Marital Status: Married    Spouse Name: N/A    Number of Children: N/A  . Years of Education: N/A   Occupational History  . Not on file.   Social History Main Topics  . Smoking status: Never Smoker   . Smokeless tobacco: Never Used  . Alcohol Use: No  . Drug Use: No  . Sexual Activity: Yes   Other Topics Concern  .  Not on file   Social History Narrative  :  Pertinent items are noted in HPI.  Exam: ECOG 0 There were no vitals taken for this visit. General appearance: alert and cooperative Head: Normocephalic, without obvious abnormality Throat: lips, mucosa, and tongue normal; teeth and gums normal Neck: no adenopathy Back: negative Resp: clear to auscultation bilaterally Cardio: regular rate and rhythm, S1, S2 normal, no murmur, click, rub or gallop GI: soft, non-tender; bowel sounds normal; no masses,  no organomegaly Extremities: extremities normal, atraumatic, no cyanosis or edema Pulses: 2+ and symmetric Skin: Skin color, texture, turgor normal. No rashes or lesions     Assessment and Plan:   69 year old gentleman diagnosed with prostate cancer after presenting with a PSA of 2.66. He underwent a prostate  biopsy December 2015 and showed a Gleason score 4+3 = 7 in 2 cores and a Gleason score 3+4 = 7 in one core. His case was discussed today in the prostate cancer multidisciplinary clinic. His pathology was discussed with the reviewing pathologist. The results of the discussion was discussed with the patient and the natural course of this disease was discussed extensively. Options of treatments were include surgical resection with a radical prostatectomy versus radiation therapy concomitantly with androgen depravation for his intermediate risk prostate cancer. I see no role for systemic therapy at this time beyond short-term androgen deprivation if he decided to proceed with radiation set of surgery. He will be an excellent candidate for surgery should he elects to do so. He has excellent performance status without many risk factors for surgical resection.  All her questions were answered to his satisfaction.

## 2014-05-26 NOTE — Progress Notes (Signed)
Please see consult note.  

## 2014-05-26 NOTE — Progress Notes (Signed)
[place] Psychosocial Distress Screening Clinical Social Work Prostate Clinic    Clinical Social Work met with pt and his wife during Prostate Clinic to review distress screening protocol and discuss resources at [facility] to assist. The patient scored a 1 on the Psychosocial Distress Thermometer which indicates mild distress. Clinical Social Worker reviewed role of CSW, Pt and Family Support Team and resources such as Prostate Support Group. They both deny concerns and feel they had their questions answered today. They are very interested in the resources here and plan to follow up. CSW also reviewed ARD/HCPOA paperwork and they may contact CSW to assist with completion. They were provided handouts re. Patient and Family Support Team, Prostate Group and ADRs. They have CSW contact for follow up as needed.     Clinical Social Worker follow up needed: No.  If yes, follow up plan: Loren Racer, [place]  [facility] Phone: [phone] Fax: [phone]

## 2014-05-26 NOTE — Addendum Note (Signed)
Encounter addended by: Lora Paula, MD on: 05/26/2014  6:00 PM<BR>     Documentation filed: Clinical Notes, Notes Section

## 2014-05-26 NOTE — Progress Notes (Unsigned)
                               Care Plan Summary  Name: Wayne Kaufman DOB: November 13, 1945   Your Medical Team:   Urologist -  Dr. Raynelle Bring, [facility]  Radiation Oncologist - Dr. Tyler Pita, [facility]   Medical Oncologist - Dr. Zola Button, [place]  Recommendations: 1) Radiation  2) Surgery    * These recommendations are based on information available as of today's consult.      Recommendations may change depending on the results of further tests or exams.    Next Steps: 1) Dr.Borden's office will call you with appointments for CT and Bone scan  2) Dr. Alinda Money will set up an appointment to discuss results and place gold markers. 3) Dr. Johny Shears office will call you with appointments for radiation treatments  When appointments need to be scheduled, you will be contacted by [facility] and/or [facility].  Questions?  Please do not hesitate to call Cira Rue, RN, BSN, CRNI at [phone] any questions or concerns.  Shirlean Mylar is your Oncology Nurse Navigator and is available to assist you while you're receiving your medical care at [facility].

## 2014-05-26 NOTE — Progress Notes (Signed)
Here today with spouse, [name]. Denies having children. 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 Wears glasses. Reports weight changes. Denies a hx of radiation therapy. Denies having a pacemaker. Reports allergy to codeine and amoxicillin. Preferred name: Wayne Kaufman. PCP: Hulan Fess. Urologist: Dr. Alinda Money. Pharmacy: [facility] [place]. Cardiologist:Dr. Irish Lack

## 2014-05-26 NOTE — Addendum Note (Signed)
Encounter addended by: Lora Paula, MD on: 05/26/2014  6:15 PM<BR>     Documentation filed: Follow-up Section, LOS Section

## 2014-05-26 NOTE — Consult Note (Signed)
Chief Complaint  Prostate Cancer   Reason For Visit  Reason for consult: To discuss treatment options for prostate cancer. Location of consult: [place]   History of Present Illness  Wayne Kaufman is a 69 year old gentleman noted to have a rising PSA from 1.59 in July 2014 to 3.55 in August 2015.  His PSA was repeated after an empiric course of ciprofloxacin and decreased to 2.66 but still above his baseline. During his urologic evaluation, he was also noted to have a small right apical prostate nodule.  He underwent a prostate needle biopsy on 05/05/14 which demonstrated Gleason 4+3=7 adenocarcinoma of the prostate with 3 out of 12 biopsy cores positive for malignancy.  He has no family history of prostate cancer. He has undergone a prior mitral valve repair but otherwise denies any significant medical comorbid conditions.  He did have a traumatic pelvic fracture from a fall off a tree.  He did not require surgical intervention.  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  TNM stage: cT2a Nx Mx Gleason score: 4+3=7 PSA: 2.66 Biopsy (05/05/14): 3/12 cores positive -- R mid (30%, 4+3=7, PNI), R base (60%, 4+3=7), R lateral base (90%, 3+4=7, PNI) Prostate volume: 19.5 cc  Nomogram OC disease: 42% EPE: 56% SVI: 6% LNI: 9% PFS (surgery): 83% at 5 years, 73% at 10 years  Urinary function: IPSS is 5. Erectile function: SHIM score is 22.  Interval history:  He follows up today in the company of his wife to discuss his recent prostate cancer diagnosis.  He has recovered well from his recent biopsy and is seen in the multidisciplinary clinic today to discuss his treatment options.   Past Medical History  1. History of cardiac murmur (Z86.79)  2. History of fracture of pelvis (Z87.81)  3. History of hypertension (Z86.79)  4. History of Shoulder fracture, left (S42.92XA)  Surgical History  1. History of Mitral Valve Repair  Current  Meds  1. AmLODIPine Besylate 2.5 MG Oral Tablet;  Therapy: (Recorded:19Nov2015) to Recorded  2. Aspir-81 81 MG Oral Tablet Delayed Release;  Therapy: (Recorded:19Nov2015) to Recorded  3. Gentamicin Sulfate 40 MG/ML Injection Solution; INJECT 2 ML Intramuscular; To Be  Done: [id number]; Status: HOLD FOR - Administration Ordered  4. Losartan Potassium 100 MG Oral Tablet;  Therapy: (Recorded:19Nov2015) to Recorded  5. Multi-Vitamin TABS;  Therapy: (Recorded:19Nov2015) to Recorded  Allergies  1. Amoxicillin TABS  2. Codeine Derivatives  Family History  1. Family history of colon cancer (Z80.0) : Father  2. Family history of congenital heart disease (Z82.79) : Sister  3. Family history of congestive heart failure (Z82.49) : Mother  Social History   Denied: History of Alcohol use   Married   Never a smoker   Occupation  Physical Exam Constitutional: Well nourished and well developed . No acute distress.    Results/Data  We have reviewed his PSA results, medical records, and his pathology slides independently today in the multidisciplinary conference.  Findings are as outlined above.  Specifically, looking at his biopsy slides, there is concern that he does have very high-grade disease in parts of the prostate including some cancerous glands that are borderline Gleason pattern 5.     Assessment  1. Prostate cancer (C61)  Discussion/Summary  1.  Prostate cancer: Considering the review of his prostate biopsy slides today which suggested potentially very high-grade cancer that is producing fairly low amount of PSA, it was the consensus  of the clinic participants for the patient to proceed with definitive staging including a bone scan and CT scan of the pelvis.  The CT scan of the pelvis was also lend further information about his pelvic fracture.   The patient was counseled about the natural history of prostate cancer and the standard treatment options that are available for prostate  cancer. It was explained to him how his age and life expectancy, clinical stage, Gleason score, and PSA affect his prognosis, the decision to proceed with additional staging studies, as well as how that information influences recommended treatment strategies. We discussed the roles for active surveillance, radiation therapy, surgical therapy, androgen deprivation, as well as ablative therapy options for the treatment of prostate cancer as appropriate to his individual cancer situation. We discussed the risks and benefits of these options with regard to their impact on cancer control and also in terms of potential adverse events, complications, and impact on quiality of life particularly related to urinary, bowel, and sexual function. The patient was encouraged to ask questions throughout the discussion today and all questions were answered to his stated satisfaction. In addition, the patient was provided with and/or directed to appropriate resources and literature for further education about prostate cancer and treatment options.   He is scheduled to see both Dr. Tammi Klippel and Dr. Alen Blew later this morning to further discuss his options.  He will be scheduled for a bone scan and CT scan for staging purposes and a follow-up visit with me to discuss these results as well as further discuss his options for treatment.  He understands that it has been recommended for him to proceed with therapy of curative intent although he understands that he has both the option of surgical therapy or radiation therapy which would have an equivalent chance for a successful outcome.  Cc: Dr. Tyler Pita Dr. Zola Button Dr. Hulan Fess  A total of 50 minutes were spent in the overall care of the patient today with 50 minutes in direct face to face consultation.    Signatures Electronically signed by : Wayne Kaufman, M.D.; May 26 2014 12:56PM EST

## 2014-05-29 ENCOUNTER — Encounter: Payer: Self-pay | Admitting: *Deleted

## 2014-06-12 ENCOUNTER — Encounter (HOSPITAL_COMMUNITY)
Admission: RE | Admit: 2014-06-12 | Discharge: 2014-06-12 | Disposition: A | Discharge status: Home / Self Care | Payer: Medicare Other | Source: Ambulatory Visit | Attending: Urology | Admitting: Urology

## 2014-06-12 DIAGNOSIS — C61 Malignant neoplasm of prostate: Secondary | ICD-10-CM | POA: Insufficient documentation

## 2014-06-12 MED ORDER — TECHNETIUM TC 99M MEDRONATE IV KIT
26.5000 | PACK | Freq: Once | INTRAVENOUS | Status: AC | PRN
  Administered 2014-06-12: 26.5 via INTRAVENOUS

## 2014-06-21 ENCOUNTER — Telehealth: Payer: Self-pay | Admitting: *Deleted

## 2014-06-21 NOTE — Telephone Encounter (Signed)
Called patient to inform of sim appt. For 06-30-14 @ 11 am, spoke with patient and he is aware of this appt.

## 2014-06-30 ENCOUNTER — Ambulatory Visit
Admission: RE | Admit: 2014-06-30 | Discharge: 2014-06-30 | Disposition: A | Discharge status: Home / Self Care | Payer: Medicare Other | Source: Ambulatory Visit | Attending: Radiation Oncology | Admitting: Radiation Oncology

## 2014-06-30 DIAGNOSIS — C61 Malignant neoplasm of prostate: Secondary | ICD-10-CM | POA: Diagnosis not present

## 2014-06-30 DIAGNOSIS — Z510 Encounter for antineoplastic radiation therapy: Secondary | ICD-10-CM | POA: Insufficient documentation

## 2014-06-30 DIAGNOSIS — Z7982 Long term (current) use of aspirin: Secondary | ICD-10-CM | POA: Diagnosis not present

## 2014-06-30 NOTE — Progress Notes (Signed)
  Radiation Oncology         [phone] ________________________________  Name: Wayne Kaufman MRN: [id number]  Date: 06/30/2014  DOB: 09-04-1945  SIMULATION AND TREATMENT PLANNING NOTE    ICD-9-CM ICD-10-CM   1. Malignant neoplasm of prostate 185 C61     DIAGNOSIS:  69 y.o. 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  Identity was confirmed.  All relevant records and images related to the planned course of therapy were reviewed.  The patient freely provided informed written consent to proceed with treatment after reviewing the details related to the planned course of therapy. The consent form was witnessed and verified by the simulation staff.  Then, the patient was set-up in a stable reproducible supine position for radiation therapy.  A vacuum lock pillow device was custom fabricated to position his legs in a reproducible immobilized position.  Then, I performed a urethrogram under sterile conditions to identify the prostatic apex.  CT images were obtained.  Surface markings were placed.  The CT images were loaded into the planning software.  Then the prostate target and avoidance structures including the rectum, bladder, bowel and hips were contoured.  Treatment planning then occurred.  The radiation prescription was entered and confirmed.  A total of one complex treatment device was fabricated. I have requested : Intensity Modulated Radiotherapy (IMRT) is medically necessary for this case for the following reason:  Rectal sparing.Marland Kitchen  PLAN:  The patient will receive 78 Gy in 40 fractions.  ________________________________  Sheral Apley Tammi Klippel, M.D.

## 2014-07-03 ENCOUNTER — Encounter: Payer: Self-pay | Admitting: Medical Oncology

## 2014-07-03 NOTE — [facility] Oncology Navigator Note (Signed)
Late entry- Friday Mr. Delisle was here for his simulation to start radiation for prostate cancer. I saw him and his wife [name] in radiation post simulation and he is stated it was not too bad except for the catheter. He is aware this will be the only time he will be catheterized. He will begin his treatments 2/23. I asked them to call me with any questions or concerns. They voiced understanding.    Cira Rue, RN, BSN, [place]  [phone]  Fax [phone]

## 2014-07-04 DIAGNOSIS — Z510 Encounter for antineoplastic radiation therapy: Secondary | ICD-10-CM | POA: Diagnosis not present

## 2014-07-07 DIAGNOSIS — Z510 Encounter for antineoplastic radiation therapy: Secondary | ICD-10-CM | POA: Diagnosis not present

## 2014-07-11 ENCOUNTER — Ambulatory Visit
Admission: RE | Admit: 2014-07-11 | Discharge: 2014-07-11 | Disposition: A | Discharge status: Home / Self Care | Payer: Medicare Other | Source: Ambulatory Visit | Attending: Radiation Oncology | Admitting: Radiation Oncology

## 2014-07-11 ENCOUNTER — Encounter: Payer: Self-pay | Admitting: Medical Oncology

## 2014-07-11 DIAGNOSIS — Z510 Encounter for antineoplastic radiation therapy: Secondary | ICD-10-CM | POA: Diagnosis not present

## 2014-07-11 NOTE — [facility] Oncology Navigator Note (Addendum)
Wayne Kaufman here today for his first radiation treatment. He was in great spirits and states that was too easy. After Dr. Valere Dross reviewed his scan he is was encouraged to drink more fluids to increase the filling of his bladder before his treatments. He voiced understanding and I encouraged him to call with any questions or concerns.    Cira Rue, RN, BSN, [place]  [phone] Fax [phone]

## 2014-07-11 NOTE — Progress Notes (Signed)
  Radiation Oncology         [phone] ________________________________  Name: Wayne Kaufman MRN: [id number]  Date: 07/12/2014  DOB: [birth year]      Weekly Radiation Therapy Management    ICD-9-CM ICD-10-CM   1. Malignant neoplasm of prostate 185 C61     Current Dose: 3.9 Gy     Planned Dose:  78 Gy  Narrative . . . . . . . . The patient presents for routine under treatment assessment.                                   Weight and vitals stable. Denies pain. Denies nocturia. Denies hematuria, dysuria or diarrhea. Denies fatigue.  Oriented patient to staff and routine of the clinic. Provided patient with RADIATION THERAPY AND YOU handbook then, reviewed pertinent information. Educated patient reference potential side effects and management such as, urinary/bladder changes, fatigue, and diarrhea. Patient verbalized understanding of all reviewed                                  Set-up films were reviewed.                                 The chart was checked. Physical Findings. . .  vitals were not taken for this visit.. Weight essentially stable.  No significant changes. Impression . . . . . . . The patient is tolerating radiation. Plan . . . . . . . . . . . . Continue treatment as planned.  ________________________________  Sheral Apley. Tammi Klippel, M.D.

## 2014-07-12 ENCOUNTER — Ambulatory Visit
Admission: RE | Admit: 2014-07-12 | Discharge: 2014-07-12 | Disposition: A | Discharge status: Home / Self Care | Payer: 59 | Source: Ambulatory Visit | Attending: Radiation Oncology | Admitting: Radiation Oncology

## 2014-07-12 ENCOUNTER — Encounter: Payer: Self-pay | Admitting: Radiation Oncology

## 2014-07-12 ENCOUNTER — Ambulatory Visit
Admission: RE | Admit: 2014-07-12 | Discharge: 2014-07-12 | Disposition: A | Discharge status: Home / Self Care | Payer: Medicare Other | Source: Ambulatory Visit | Attending: Radiation Oncology | Admitting: Radiation Oncology

## 2014-07-12 VITALS — BP 153/89 | HR 66 | Resp 16 | Wt 183.7 lb

## 2014-07-12 DIAGNOSIS — C61 Malignant neoplasm of prostate: Secondary | ICD-10-CM

## 2014-07-12 DIAGNOSIS — Z510 Encounter for antineoplastic radiation therapy: Secondary | ICD-10-CM | POA: Diagnosis not present

## 2014-07-12 NOTE — Progress Notes (Addendum)
Weight and vitals stable. Denies pain. Denies nocturia. Denies hematuria, dysuria or diarrhea. Denies fatigue.  Oriented patient to staff and routine of the clinic. Provided patient with RADIATION THERAPY AND YOU handbook then, reviewed pertinent information. Educated patient reference potential side effects and management such as, urinary/bladder changes, fatigue, and diarrhea. Patient verbalized understanding of all reviewed. Encouraged to contact this RN with future needs.

## 2014-07-13 ENCOUNTER — Ambulatory Visit
Admission: RE | Admit: 2014-07-13 | Discharge: 2014-07-13 | Disposition: A | Discharge status: Home / Self Care | Payer: Medicare Other | Source: Ambulatory Visit | Attending: Radiation Oncology | Admitting: Radiation Oncology

## 2014-07-13 DIAGNOSIS — Z510 Encounter for antineoplastic radiation therapy: Secondary | ICD-10-CM | POA: Diagnosis not present

## 2014-07-14 ENCOUNTER — Ambulatory Visit
Admission: RE | Admit: 2014-07-14 | Discharge: 2014-07-14 | Disposition: A | Discharge status: Home / Self Care | Payer: Medicare Other | Source: Ambulatory Visit | Attending: Radiation Oncology | Admitting: Radiation Oncology

## 2014-07-14 DIAGNOSIS — Z510 Encounter for antineoplastic radiation therapy: Secondary | ICD-10-CM | POA: Diagnosis not present

## 2014-07-17 ENCOUNTER — Ambulatory Visit
Admission: RE | Admit: 2014-07-17 | Discharge: 2014-07-17 | Disposition: A | Discharge status: Home / Self Care | Payer: Medicare Other | Source: Ambulatory Visit | Attending: Radiation Oncology | Admitting: Radiation Oncology

## 2014-07-17 ENCOUNTER — Encounter: Payer: Self-pay | Admitting: Medical Oncology

## 2014-07-17 DIAGNOSIS — Z510 Encounter for antineoplastic radiation therapy: Secondary | ICD-10-CM | POA: Diagnosis not present

## 2014-07-17 NOTE — [facility] Oncology Navigator Note (Signed)
I met with Mr. Wayne Kaufman and his wife [name] before his radiation treatment today. He states he is doing well with no complaints. I asked him to call me with any questions or complaints. He voiced understanding.

## 2014-07-18 ENCOUNTER — Ambulatory Visit
Admission: RE | Admit: 2014-07-18 | Discharge: 2014-07-18 | Disposition: A | Discharge status: Home / Self Care | Payer: Medicare Other | Source: Ambulatory Visit | Attending: Radiation Oncology | Admitting: Radiation Oncology

## 2014-07-18 DIAGNOSIS — Z510 Encounter for antineoplastic radiation therapy: Secondary | ICD-10-CM | POA: Diagnosis not present

## 2014-07-19 ENCOUNTER — Ambulatory Visit
Admission: RE | Admit: 2014-07-19 | Discharge: 2014-07-19 | Disposition: A | Discharge status: Home / Self Care | Payer: Medicare Other | Source: Ambulatory Visit | Attending: Radiation Oncology | Admitting: Radiation Oncology

## 2014-07-19 DIAGNOSIS — Z510 Encounter for antineoplastic radiation therapy: Secondary | ICD-10-CM | POA: Diagnosis not present

## 2014-07-20 ENCOUNTER — Ambulatory Visit
Admission: RE | Admit: 2014-07-20 | Discharge: 2014-07-20 | Disposition: A | Discharge status: Home / Self Care | Payer: Medicare Other | Source: Ambulatory Visit | Attending: Radiation Oncology | Admitting: Radiation Oncology

## 2014-07-20 DIAGNOSIS — Z510 Encounter for antineoplastic radiation therapy: Secondary | ICD-10-CM | POA: Diagnosis not present

## 2014-07-21 ENCOUNTER — Ambulatory Visit
Admission: RE | Admit: 2014-07-21 | Discharge: 2014-07-21 | Disposition: A | Discharge status: Home / Self Care | Payer: Medicare Other | Source: Ambulatory Visit | Attending: Radiation Oncology | Admitting: Radiation Oncology

## 2014-07-21 ENCOUNTER — Encounter: Payer: Self-pay | Admitting: Radiation Oncology

## 2014-07-21 ENCOUNTER — Ambulatory Visit
Admission: RE | Admit: 2014-07-21 | Discharge: 2014-07-21 | Disposition: A | Discharge status: Home / Self Care | Payer: 59 | Source: Ambulatory Visit | Attending: Radiation Oncology | Admitting: Radiation Oncology

## 2014-07-21 VITALS — BP 145/98 | HR 70 | Resp 16 | Wt 182.6 lb

## 2014-07-21 DIAGNOSIS — Z510 Encounter for antineoplastic radiation therapy: Secondary | ICD-10-CM | POA: Diagnosis not present

## 2014-07-21 DIAGNOSIS — C61 Malignant neoplasm of prostate: Secondary | ICD-10-CM

## 2014-07-21 NOTE — Progress Notes (Signed)
   Department of Radiation Oncology  Phone:  [phone] Fax:        [phone]  Weekly Treatment Note    Name: Wayne Kaufman Date: 07/21/2014 MRN: [id number] DOB: Mar 04, 1946   Current dose: 17.55 Gy  Current fraction:9   MEDICATIONS: Current Outpatient Prescriptions  Medication Sig Dispense Refill  . amLODipine (NORVASC) 2.5 MG tablet TAKE 1 TABLET (2.5 MG TOTAL) BY MOUTH DAILY. 90 tablet 3  . aspirin 81 MG tablet Take 81 mg by mouth daily.    Marland Kitchen losartan (COZAAR) 100 MG tablet TAKE ONE TABLET BY MOUTH ONCE DAILY 90 tablet 1  . Multiple Vitamin (MULTIVITAMIN) tablet Take 1 tablet by mouth daily.     No current facility-administered medications for this encounter.     ALLERGIES: Amoxicillin and Codeine   LABORATORY DATA:  Lab Results  Component Value Date   WBC 10.0 04/09/2009   HGB 11.3 DELTA CHECK NOTED POST TRANSFUSION SPECIMEN* 04/09/2009   HCT 33.0* 04/09/2009   MCV 96.8 04/09/2009   PLT 118 DELTA CHECK NOTED REPEATED TO VERIFY* 04/09/2009   Lab Results  Component Value Date   NA 134* 04/09/2009   K 3.9 04/09/2009   CL 100 04/09/2009   CO2 26 04/09/2009   Lab Results  Component Value Date   ALT 16 03/30/2009   AST 21 03/30/2009   ALKPHOS 63 03/30/2009   BILITOT 1.0 03/30/2009     NARRATIVE: Wayne Kaufman was seen today for weekly treatment management. The chart was checked and the patient's films were reviewed.  Weight and vitals stable. Denies pain. Denies nocturia. Denies hematuria, dysuria or diarrhea. Denies fatigue. Reports that he is having difficulty ensuring his bladder is full for treatment.  PHYSICAL EXAMINATION: weight is 182 lb 9.6 oz (82.827 kg). His blood pressure is 145/98 and his pulse is 70. His respiration is 16.        ASSESSMENT: The patient is doing satisfactorily with treatment.patient is doing very well.  PLAN: We will continue with the patient's radiation treatment as planned.

## 2014-07-21 NOTE — Progress Notes (Signed)
Weight and vitals stable. Denies pain. Denies nocturia. Denies hematuria, dysuria or diarrhea. Denies fatigue. Reports that he is having difficulty ensuring his bladder is full for treatment.

## 2014-07-24 ENCOUNTER — Ambulatory Visit
Admission: RE | Admit: 2014-07-24 | Discharge: 2014-07-24 | Disposition: A | Discharge status: Home / Self Care | Payer: Medicare Other | Source: Ambulatory Visit | Attending: Radiation Oncology | Admitting: Radiation Oncology

## 2014-07-24 ENCOUNTER — Encounter: Payer: Self-pay | Admitting: Medical Oncology

## 2014-07-24 DIAGNOSIS — Z510 Encounter for antineoplastic radiation therapy: Secondary | ICD-10-CM | POA: Diagnosis not present

## 2014-07-24 NOTE — Progress Notes (Signed)
Pt here today for radiation. He states he is doing very well without any complaints or concerns. I asked him to call me with any questions or concerns. He voiced understanding.

## 2014-07-25 ENCOUNTER — Ambulatory Visit
Admission: RE | Admit: 2014-07-25 | Discharge: 2014-07-25 | Disposition: A | Discharge status: Home / Self Care | Payer: Medicare Other | Source: Ambulatory Visit | Attending: Radiation Oncology | Admitting: Radiation Oncology

## 2014-07-25 DIAGNOSIS — Z510 Encounter for antineoplastic radiation therapy: Secondary | ICD-10-CM | POA: Diagnosis not present

## 2014-07-26 ENCOUNTER — Ambulatory Visit
Admission: RE | Admit: 2014-07-26 | Discharge: 2014-07-26 | Disposition: A | Discharge status: Home / Self Care | Payer: Medicare Other | Source: Ambulatory Visit | Attending: Radiation Oncology | Admitting: Radiation Oncology

## 2014-07-26 DIAGNOSIS — Z510 Encounter for antineoplastic radiation therapy: Secondary | ICD-10-CM | POA: Diagnosis not present

## 2014-07-27 ENCOUNTER — Ambulatory Visit
Admission: RE | Admit: 2014-07-27 | Discharge: 2014-07-27 | Disposition: A | Discharge status: Home / Self Care | Payer: Medicare Other | Source: Ambulatory Visit | Attending: Radiation Oncology | Admitting: Radiation Oncology

## 2014-07-27 ENCOUNTER — Encounter: Payer: Self-pay | Admitting: Radiation Oncology

## 2014-07-27 ENCOUNTER — Ambulatory Visit
Admission: RE | Admit: 2014-07-27 | Discharge: 2014-07-27 | Disposition: A | Discharge status: Home / Self Care | Payer: 59 | Source: Ambulatory Visit | Attending: Radiation Oncology | Admitting: Radiation Oncology

## 2014-07-27 VITALS — BP 135/79 | HR 60 | Resp 16 | Wt 181.4 lb

## 2014-07-27 DIAGNOSIS — Z510 Encounter for antineoplastic radiation therapy: Secondary | ICD-10-CM | POA: Diagnosis not present

## 2014-07-27 DIAGNOSIS — C61 Malignant neoplasm of prostate: Secondary | ICD-10-CM

## 2014-07-27 NOTE — Progress Notes (Signed)
  Radiation Oncology         [phone] ________________________________  Name: Wayne Kaufman  MRN: [id number]  Date: 07/27/2014  DOB: 11-21-1945      Weekly Radiation Therapy Management    ICD-9-CM ICD-10-CM   1. Malignant neoplasm of prostate 185 C61     Current Dose: 25.35 Gy     Planned Dose:  78 Gy  Narrative . . . . . . . . The patient presents for routine under treatment assessment.                                   Weight and vitals stable. Denies pain. Denies nocturia, hematuria, or dysuria. Reports mild fatigue. Denies pain                                 Set-up films were reviewed.                                 The chart was checked. Physical Findings. . .  vitals were not taken for this visit.. Weight essentially stable.  No significant changes. Impression . . . . . . . The patient is tolerating radiation. Plan . . . . . . . . . . . . Continue treatment as planned.  ________________________________  Sheral Apley. Tammi Klippel, M.D.

## 2014-07-27 NOTE — Progress Notes (Signed)
Weight and vitals stable. Denies pain. Denies nocturia, hematuria, or dysuria. Reports mild fatigue. Denies pain.

## 2014-07-28 ENCOUNTER — Ambulatory Visit
Admission: RE | Admit: 2014-07-28 | Discharge: 2014-07-28 | Disposition: A | Discharge status: Home / Self Care | Payer: Medicare Other | Source: Ambulatory Visit | Attending: Radiation Oncology | Admitting: Radiation Oncology

## 2014-07-28 DIAGNOSIS — Z510 Encounter for antineoplastic radiation therapy: Secondary | ICD-10-CM | POA: Diagnosis not present

## 2014-07-31 ENCOUNTER — Ambulatory Visit
Admission: RE | Admit: 2014-07-31 | Discharge: 2014-07-31 | Disposition: A | Discharge status: Home / Self Care | Payer: Medicare Other | Source: Ambulatory Visit | Attending: Radiation Oncology | Admitting: Radiation Oncology

## 2014-07-31 ENCOUNTER — Encounter: Payer: Self-pay | Admitting: Medical Oncology

## 2014-07-31 DIAGNOSIS — Z510 Encounter for antineoplastic radiation therapy: Secondary | ICD-10-CM | POA: Diagnosis not present

## 2014-07-31 NOTE — [facility] Oncology Navigator Note (Signed)
I saw Mr. Hashemi today in radiation. He is doing well except he has increased fatigue. He states that he has been taking nap every evening. He is aware this is a side effect of the radiation. I asked him to call me with any questions or concerns.     Cira Rue, RN, BSN, [place]  [phone]  Fax [phone]

## 2014-08-01 ENCOUNTER — Ambulatory Visit
Admission: RE | Admit: 2014-08-01 | Discharge: 2014-08-01 | Disposition: A | Discharge status: Home / Self Care | Payer: Medicare Other | Source: Ambulatory Visit | Attending: Radiation Oncology | Admitting: Radiation Oncology

## 2014-08-01 DIAGNOSIS — Z510 Encounter for antineoplastic radiation therapy: Secondary | ICD-10-CM | POA: Diagnosis not present

## 2014-08-02 ENCOUNTER — Ambulatory Visit
Admission: RE | Admit: 2014-08-02 | Discharge: 2014-08-02 | Disposition: A | Discharge status: Home / Self Care | Payer: Medicare Other | Source: Ambulatory Visit | Attending: Radiation Oncology | Admitting: Radiation Oncology

## 2014-08-02 DIAGNOSIS — Z510 Encounter for antineoplastic radiation therapy: Secondary | ICD-10-CM | POA: Diagnosis not present

## 2014-08-03 ENCOUNTER — Ambulatory Visit
Admission: RE | Admit: 2014-08-03 | Discharge: 2014-08-03 | Disposition: A | Discharge status: Home / Self Care | Payer: Medicare Other | Source: Ambulatory Visit | Attending: Radiation Oncology | Admitting: Radiation Oncology

## 2014-08-03 DIAGNOSIS — Z510 Encounter for antineoplastic radiation therapy: Secondary | ICD-10-CM | POA: Diagnosis not present

## 2014-08-04 ENCOUNTER — Ambulatory Visit
Admission: RE | Admit: 2014-08-04 | Discharge: 2014-08-04 | Disposition: A | Discharge status: Home / Self Care | Payer: Medicare Other | Source: Ambulatory Visit | Attending: Radiation Oncology | Admitting: Radiation Oncology

## 2014-08-04 ENCOUNTER — Ambulatory Visit
Admission: RE | Admit: 2014-08-04 | Discharge: 2014-08-04 | Disposition: A | Discharge status: Home / Self Care | Payer: 59 | Source: Ambulatory Visit | Attending: Radiation Oncology | Admitting: Radiation Oncology

## 2014-08-04 ENCOUNTER — Encounter: Payer: Self-pay | Admitting: Radiation Oncology

## 2014-08-04 VITALS — BP 139/79 | HR 63 | Temp 98.0°F | Resp 16 | Wt 179.2 lb

## 2014-08-04 DIAGNOSIS — C61 Malignant neoplasm of prostate: Secondary | ICD-10-CM

## 2014-08-04 DIAGNOSIS — Z510 Encounter for antineoplastic radiation therapy: Secondary | ICD-10-CM | POA: Diagnosis not present

## 2014-08-04 NOTE — Progress Notes (Signed)
Weight and vitals stable. Reports pressure in left groin x 3-4 days. Denies pain. Denies nocturia, hematuria, or dysuria. Denies diarrhea. Reports mild fatigue.

## 2014-08-04 NOTE — Progress Notes (Signed)
  Radiation Oncology         [phone] ________________________________  Name: Wayne Kaufman  MRN: [id number]  Date: 08/04/2014  DOB: Dec 07, 1945      Weekly Radiation Therapy Management    ICD-9-CM ICD-10-CM   1. Malignant neoplasm of prostate 185 C61     Current Dose: 37.05 Gy     Planned Dose:  78 Gy  Narrative . . . . . . . . The patient presents for routine under treatment assessment.                                   Weight and vitals stable. Reports pressure in left groin x 3-4 days. Denies pain. Denies nocturia, hematuria, or dysuria. Denies diarrhea. Reports mild fatigue.                                  Set-up films were reviewed.                                 The chart was checked. Physical Findings. . .  weight is 179 lb 3.2 oz (81.285 kg). His oral temperature is 98 F (36.7 C). His blood pressure is 139/79 and his pulse is 63. His respiration is 16 and oxygen saturation is 100%. . Weight essentially stable.  No significant changes. Impression . . . . . . . The patient is tolerating radiation. Plan . . . . . . . . . . . . Continue treatment as planned.  ________________________________  Sheral Apley. Tammi Klippel, M.D.

## 2014-08-07 ENCOUNTER — Ambulatory Visit
Admission: RE | Admit: 2014-08-07 | Discharge: 2014-08-07 | Disposition: A | Discharge status: Home / Self Care | Payer: Medicare Other | Source: Ambulatory Visit | Attending: Radiation Oncology | Admitting: Radiation Oncology

## 2014-08-07 ENCOUNTER — Encounter: Payer: Self-pay | Admitting: Medical Oncology

## 2014-08-07 DIAGNOSIS — Z510 Encounter for antineoplastic radiation therapy: Secondary | ICD-10-CM | POA: Diagnosis not present

## 2014-08-07 NOTE — [facility] Oncology Navigator Note (Signed)
I saw pt today after his radiation treatment. He states he is doing well. No new issues other than having increased fatigue and needing a nap in the afternoon.     Cira Rue, RN, BSN, [place]  [phone] Fax [phone]

## 2014-08-08 ENCOUNTER — Ambulatory Visit
Admission: RE | Admit: 2014-08-08 | Discharge: 2014-08-08 | Disposition: A | Discharge status: Home / Self Care | Payer: Medicare Other | Source: Ambulatory Visit | Attending: Radiation Oncology | Admitting: Radiation Oncology

## 2014-08-08 DIAGNOSIS — Z510 Encounter for antineoplastic radiation therapy: Secondary | ICD-10-CM | POA: Diagnosis not present

## 2014-08-09 ENCOUNTER — Telehealth: Payer: Self-pay | Admitting: Radiation Oncology

## 2014-08-09 ENCOUNTER — Ambulatory Visit
Admission: RE | Admit: 2014-08-09 | Discharge: 2014-08-09 | Disposition: A | Discharge status: Home / Self Care | Payer: Medicare Other | Source: Ambulatory Visit | Attending: Radiation Oncology | Admitting: Radiation Oncology

## 2014-08-09 DIAGNOSIS — Z510 Encounter for antineoplastic radiation therapy: Secondary | ICD-10-CM | POA: Diagnosis not present

## 2014-08-09 NOTE — Progress Notes (Signed)
  Radiation Oncology         [phone] ________________________________  Name: Wayne Kaufman  MRN: [id number]  Date: 08/10/2014  DOB: March 21, 1946      Weekly Radiation Therapy Management    ICD-9-CM ICD-10-CM   1. Malignant neoplasm of prostate 185 C61     Current Dose: 44.85 Gy     Planned Dose:  78 Gy  Narrative . . . . . . . . The patient presents for routine under treatment assessment.                                   No complaints.                                 Set-up films were reviewed.                                 The chart was checked. Physical Findings. . .  weight is 173 lb 9.6 oz (78.744 kg). His oral temperature is 98 F (36.7 C). His blood pressure is 160/95 and his pulse is 66. His respiration is 16 and oxygen saturation is 100%. . Weight essentially stable.  No significant changes. Impression . . . . . . . The patient is tolerating radiation. Plan . . . . . . . . . . . . Continue treatment as planned.  ________________________________  Sheral Apley. Tammi Klippel, M.D.

## 2014-08-09 NOTE — Telephone Encounter (Signed)
Returned message left by patient. Explained patient would see Dr. Tammi Klippel for PUT tomorrow. Patient verbalized understanding.

## 2014-08-10 ENCOUNTER — Encounter: Payer: Self-pay | Admitting: Radiation Oncology

## 2014-08-10 ENCOUNTER — Ambulatory Visit
Admission: RE | Admit: 2014-08-10 | Discharge: 2014-08-10 | Disposition: A | Discharge status: Home / Self Care | Payer: Medicare Other | Source: Ambulatory Visit | Attending: Radiation Oncology | Admitting: Radiation Oncology

## 2014-08-10 ENCOUNTER — Ambulatory Visit
Admission: RE | Admit: 2014-08-10 | Discharge: 2014-08-10 | Disposition: A | Discharge status: Home / Self Care | Payer: 59 | Source: Ambulatory Visit | Attending: Radiation Oncology | Admitting: Radiation Oncology

## 2014-08-10 VITALS — BP 160/95 | HR 66 | Temp 98.0°F | Resp 16 | Wt 173.6 lb

## 2014-08-10 DIAGNOSIS — Z510 Encounter for antineoplastic radiation therapy: Secondary | ICD-10-CM | POA: Diagnosis not present

## 2014-08-10 DIAGNOSIS — C61 Malignant neoplasm of prostate: Secondary | ICD-10-CM

## 2014-08-10 NOTE — Progress Notes (Signed)
Weight stable. BP slightly elevated. Reports pressure in his left groin resolved. Denies pain. Denies nocturia, hematuria, or dysuria. Denies diarrhea. Reports mild fatigue.

## 2014-08-11 ENCOUNTER — Ambulatory Visit
Admission: RE | Admit: 2014-08-11 | Discharge: 2014-08-11 | Disposition: A | Discharge status: Home / Self Care | Payer: Medicare Other | Source: Ambulatory Visit | Attending: Radiation Oncology | Admitting: Radiation Oncology

## 2014-08-11 DIAGNOSIS — Z510 Encounter for antineoplastic radiation therapy: Secondary | ICD-10-CM | POA: Diagnosis not present

## 2014-08-14 ENCOUNTER — Ambulatory Visit
Admission: RE | Admit: 2014-08-14 | Discharge: 2014-08-14 | Disposition: A | Discharge status: Home / Self Care | Payer: Medicare Other | Source: Ambulatory Visit | Attending: Radiation Oncology | Admitting: Radiation Oncology

## 2014-08-14 DIAGNOSIS — Z510 Encounter for antineoplastic radiation therapy: Secondary | ICD-10-CM | POA: Diagnosis not present

## 2014-08-15 ENCOUNTER — Ambulatory Visit
Admission: RE | Admit: 2014-08-15 | Discharge: 2014-08-15 | Disposition: A | Discharge status: Home / Self Care | Payer: Medicare Other | Source: Ambulatory Visit | Attending: Radiation Oncology | Admitting: Radiation Oncology

## 2014-08-15 DIAGNOSIS — Z510 Encounter for antineoplastic radiation therapy: Secondary | ICD-10-CM | POA: Diagnosis not present

## 2014-08-16 ENCOUNTER — Ambulatory Visit
Admission: RE | Admit: 2014-08-16 | Discharge: 2014-08-16 | Disposition: A | Discharge status: Home / Self Care | Payer: Medicare Other | Source: Ambulatory Visit | Attending: Radiation Oncology | Admitting: Radiation Oncology

## 2014-08-16 DIAGNOSIS — Z510 Encounter for antineoplastic radiation therapy: Secondary | ICD-10-CM | POA: Diagnosis not present

## 2014-08-17 ENCOUNTER — Ambulatory Visit
Admission: RE | Admit: 2014-08-17 | Discharge: 2014-08-17 | Disposition: A | Discharge status: Home / Self Care | Payer: Medicare Other | Source: Ambulatory Visit | Attending: Radiation Oncology | Admitting: Radiation Oncology

## 2014-08-17 ENCOUNTER — Encounter: Payer: Self-pay | Admitting: Medical Oncology

## 2014-08-17 ENCOUNTER — Encounter: Payer: Self-pay | Admitting: Radiation Oncology

## 2014-08-17 ENCOUNTER — Ambulatory Visit
Admission: RE | Admit: 2014-08-17 | Discharge: 2014-08-17 | Disposition: A | Discharge status: Home / Self Care | Payer: 59 | Source: Ambulatory Visit | Attending: Radiation Oncology | Admitting: Radiation Oncology

## 2014-08-17 VITALS — BP 150/83 | HR 64 | Temp 98.0°F | Resp 16 | Wt 181.7 lb

## 2014-08-17 DIAGNOSIS — C61 Malignant neoplasm of prostate: Secondary | ICD-10-CM

## 2014-08-17 DIAGNOSIS — Z510 Encounter for antineoplastic radiation therapy: Secondary | ICD-10-CM | POA: Diagnosis not present

## 2014-08-17 NOTE — Progress Notes (Signed)
Weight and vitals stable. Denies pain. Reports mild fatigue. Denies diarrhea, nocturia, hematuria or dysuria.

## 2014-08-17 NOTE — Progress Notes (Signed)
  Radiation Oncology         [phone] ________________________________  Name: Wayne Kaufman MRN: [id number]  Date: 08/17/2014  DOB: 05/23/1945  Weekly Radiation Therapy Management  Diagnosis: Prostate cancer  Current Dose: 54.6 Gy     Planned Dose:  78 Gy  Narrative . . . . . . . . The patient presents for routine under treatment assessment.                                   The patient is without complaint.  He does have some mild fatigue but is able to carry on usual activities. The patient also noticed some mild discomfort in the left pelvis region but is not requiring any pain medication for this issue.                                 Set-up films were reviewed.                                 The chart was checked. Physical Findings. . .  weight is 181 lb 11.2 oz (82.419 kg). His oral temperature is 98 F (36.7 C). His blood pressure is 150/83 and his pulse is 64. His respiration is 16 and oxygen saturation is 100%. . The lungs are clear. The heart has a regular rhythm and rate. The abdomen is soft and nontender with normal bowel sounds.  Impression . . . . . . . The patient is tolerating radiation. Plan . . . . . . . . . . . . Continue treatment as planned.  ________________________________   Blair Promise, PhD, MD

## 2014-08-17 NOTE — [facility] Oncology Navigator Note (Signed)
Pt here today for radiation and follow up visit with Dr. Sondra Come. He states he is doing well with no complaints except his fatigue. He is able to do his normal activities but needs a nap in the evening. I asked him to call if any questions or concerns.

## 2014-08-18 ENCOUNTER — Ambulatory Visit
Admission: RE | Admit: 2014-08-18 | Discharge: 2014-08-18 | Disposition: A | Discharge status: Home / Self Care | Payer: Medicare Other | Source: Ambulatory Visit | Attending: Radiation Oncology | Admitting: Radiation Oncology

## 2014-08-18 DIAGNOSIS — Z510 Encounter for antineoplastic radiation therapy: Secondary | ICD-10-CM | POA: Diagnosis not present

## 2014-08-21 ENCOUNTER — Ambulatory Visit
Admission: RE | Admit: 2014-08-21 | Discharge: 2014-08-21 | Disposition: A | Discharge status: Home / Self Care | Payer: Medicare Other | Source: Ambulatory Visit | Attending: Radiation Oncology | Admitting: Radiation Oncology

## 2014-08-21 DIAGNOSIS — Z510 Encounter for antineoplastic radiation therapy: Secondary | ICD-10-CM | POA: Diagnosis not present

## 2014-08-22 ENCOUNTER — Ambulatory Visit
Admission: RE | Admit: 2014-08-22 | Discharge: 2014-08-22 | Disposition: A | Discharge status: Home / Self Care | Payer: Medicare Other | Source: Ambulatory Visit | Attending: Radiation Oncology | Admitting: Radiation Oncology

## 2014-08-22 ENCOUNTER — Encounter: Payer: Self-pay | Admitting: Medical Oncology

## 2014-08-22 DIAGNOSIS — Z510 Encounter for antineoplastic radiation therapy: Secondary | ICD-10-CM | POA: Diagnosis not present

## 2014-08-22 NOTE — [facility] Oncology Navigator Note (Signed)
I saw Wayne Kaufman post radiation treatment today. He states he is doing well. No problems but just some fatigue but takes a nap in the afternoon. He is looking forward to April 18 th, his las day of treatment.

## 2014-08-23 ENCOUNTER — Ambulatory Visit
Admission: RE | Admit: 2014-08-23 | Discharge: 2014-08-23 | Disposition: A | Discharge status: Home / Self Care | Payer: Medicare Other | Source: Ambulatory Visit | Attending: Radiation Oncology | Admitting: Radiation Oncology

## 2014-08-23 DIAGNOSIS — Z510 Encounter for antineoplastic radiation therapy: Secondary | ICD-10-CM | POA: Diagnosis not present

## 2014-08-24 ENCOUNTER — Ambulatory Visit
Admission: RE | Admit: 2014-08-24 | Discharge: 2014-08-24 | Disposition: A | Discharge status: Home / Self Care | Payer: Medicare Other | Source: Ambulatory Visit | Attending: Radiation Oncology | Admitting: Radiation Oncology

## 2014-08-24 ENCOUNTER — Ambulatory Visit
Admission: RE | Admit: 2014-08-24 | Discharge: 2014-08-24 | Disposition: A | Discharge status: Home / Self Care | Payer: 59 | Source: Ambulatory Visit | Attending: Radiation Oncology | Admitting: Radiation Oncology

## 2014-08-24 ENCOUNTER — Encounter: Payer: Self-pay | Admitting: Radiation Oncology

## 2014-08-24 VITALS — BP 158/85 | HR 63 | Resp 16 | Wt 183.3 lb

## 2014-08-24 DIAGNOSIS — C61 Malignant neoplasm of prostate: Secondary | ICD-10-CM

## 2014-08-24 DIAGNOSIS — Z510 Encounter for antineoplastic radiation therapy: Secondary | ICD-10-CM | POA: Diagnosis not present

## 2014-08-24 NOTE — Progress Notes (Signed)
Weight and vitals stable. Denies pain. Reports mild fatigue. Denies diarrhea, nocturia, hematuria or dysuria.

## 2014-08-24 NOTE — Progress Notes (Signed)
  Radiation Oncology         [phone] ________________________________  Name: Wayne Kaufman  MRN: [id number]  Date: 08/24/2014  DOB: [birth year]      Weekly Radiation Therapy Management  No diagnosis found.  Current Dose: 64.35 Gy     Planned Dose:  78 Gy  Narrative . . . . . . . . The patient presents for routine under treatment assessment.                                 Weight and vitals stable. Denies pain. Reports mild fatigue. Denies diarrhea, nocturia, hematuria or dysuria. Reports "3/4 erections"                                 Set-up films were reviewed.                                 The chart was checked. Physical Findings. . .  weight is 183 lb 4.8 oz (83.144 kg). His blood pressure is 158/85 and his pulse is 63. His respiration is 16. . Weight essentially stable.  No significant changes. Impression . . . . . . . The patient is tolerating radiation. Plan . . . . . . . . . . . . Continue treatment as planned.   This document serves as a record of services personally performed by Tyler Pita, MD. It was created on his behalf by Pearlie Oyster, a trained medical scribe. The creation of this record is based on the scribe's personal observations and the provider's statements to them. This document has been checked and approved by the attending provider.     ------------------------------------------------  Sheral Apley Tammi Klippel, M.D.

## 2014-08-25 ENCOUNTER — Ambulatory Visit
Admission: RE | Admit: 2014-08-25 | Discharge: 2014-08-25 | Disposition: A | Discharge status: Home / Self Care | Payer: Medicare Other | Source: Ambulatory Visit | Attending: Radiation Oncology | Admitting: Radiation Oncology

## 2014-08-25 DIAGNOSIS — Z510 Encounter for antineoplastic radiation therapy: Secondary | ICD-10-CM | POA: Diagnosis not present

## 2014-08-28 ENCOUNTER — Ambulatory Visit
Admission: RE | Admit: 2014-08-28 | Discharge: 2014-08-28 | Disposition: A | Discharge status: Home / Self Care | Payer: Medicare Other | Source: Ambulatory Visit | Attending: Radiation Oncology | Admitting: Radiation Oncology

## 2014-08-28 DIAGNOSIS — Z510 Encounter for antineoplastic radiation therapy: Secondary | ICD-10-CM | POA: Diagnosis not present

## 2014-08-29 ENCOUNTER — Ambulatory Visit
Admission: RE | Admit: 2014-08-29 | Discharge: 2014-08-29 | Disposition: A | Discharge status: Home / Self Care | Payer: Medicare Other | Source: Ambulatory Visit | Attending: Radiation Oncology | Admitting: Radiation Oncology

## 2014-08-29 DIAGNOSIS — Z510 Encounter for antineoplastic radiation therapy: Secondary | ICD-10-CM | POA: Diagnosis not present

## 2014-08-30 ENCOUNTER — Ambulatory Visit
Admission: RE | Admit: 2014-08-30 | Discharge: 2014-08-30 | Disposition: A | Discharge status: Home / Self Care | Payer: Medicare Other | Source: Ambulatory Visit | Attending: Radiation Oncology | Admitting: Radiation Oncology

## 2014-08-30 DIAGNOSIS — Z510 Encounter for antineoplastic radiation therapy: Secondary | ICD-10-CM | POA: Diagnosis not present

## 2014-08-31 ENCOUNTER — Ambulatory Visit
Admission: RE | Admit: 2014-08-31 | Discharge: 2014-08-31 | Disposition: A | Discharge status: Home / Self Care | Payer: Medicare Other | Source: Ambulatory Visit | Attending: Radiation Oncology | Admitting: Radiation Oncology

## 2014-08-31 DIAGNOSIS — Z510 Encounter for antineoplastic radiation therapy: Secondary | ICD-10-CM | POA: Diagnosis not present

## 2014-09-01 ENCOUNTER — Ambulatory Visit
Admission: RE | Admit: 2014-09-01 | Discharge: 2014-09-01 | Disposition: A | Discharge status: Home / Self Care | Payer: 59 | Source: Ambulatory Visit | Attending: Radiation Oncology | Admitting: Radiation Oncology

## 2014-09-01 ENCOUNTER — Ambulatory Visit
Admission: RE | Admit: 2014-09-01 | Discharge: 2014-09-01 | Disposition: A | Discharge status: Home / Self Care | Payer: Medicare Other | Source: Ambulatory Visit | Attending: Radiation Oncology | Admitting: Radiation Oncology

## 2014-09-01 DIAGNOSIS — C61 Malignant neoplasm of prostate: Secondary | ICD-10-CM

## 2014-09-01 DIAGNOSIS — Z510 Encounter for antineoplastic radiation therapy: Secondary | ICD-10-CM | POA: Diagnosis not present

## 2014-09-04 ENCOUNTER — Ambulatory Visit
Admission: RE | Admit: 2014-09-04 | Discharge: 2014-09-04 | Disposition: A | Discharge status: Home / Self Care | Payer: Medicare Other | Source: Ambulatory Visit | Attending: Radiation Oncology | Admitting: Radiation Oncology

## 2014-09-04 ENCOUNTER — Encounter: Payer: Self-pay | Admitting: Medical Oncology

## 2014-09-04 ENCOUNTER — Encounter: Payer: Self-pay | Admitting: Radiation Oncology

## 2014-09-04 DIAGNOSIS — Z510 Encounter for antineoplastic radiation therapy: Secondary | ICD-10-CM | POA: Diagnosis not present

## 2014-09-04 NOTE — [facility] Oncology Navigator Note (Signed)
Celebrated with Mr. Shugart by ringing of the bell in radiation oncology. He completed his radiation treatments today for prostate cancer. He states it was not as bad as he had anticipated. He just gets fatigued in the afternoon and has to rest or take a nap. He will follow up with Dr. Tammi Klippel 10/12/14.  I asked him to call me if he has any questions or concerns. He voiced understanding.   Cira Rue, RN, BSN, [place]  [phone] Fax [phone]

## 2014-09-08 NOTE — Progress Notes (Signed)
  Radiation Oncology         [phone] ________________________________  Name: Wayne Kaufman MRN: [id number]  Date: 09/04/2014  DOB: 26-Feb-1946     End of Treatment Note   ICD-9-CM  ICD-10-CM   1. Malignant neoplasm of prostate 185 C61     DIAGNOSIS: 69 y.o. 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  Image guidance was performed with daily cone beam CT prior to each fraction to align to gold markers in the prostate and assure proper bladder and rectal fill volumes.  Immobilization was achieved with BodyFix custom mold.  Narrative: The patient tolerated radiation treatment relatively well.   The patient experienced some minor urinary irritation and modest fatigue.    Plan: The patient has completed radiation treatment. He will return to radiation oncology clinic for routine followup in one month. I advised him to call or return sooner if he has any questions or concerns related to his recovery or treatment. ________________________________  Sheral Apley. Tammi Klippel, M.D.

## 2014-10-12 ENCOUNTER — Ambulatory Visit
Admission: RE | Admit: 2014-10-12 | Discharge: 2014-10-12 | Disposition: A | Discharge status: Home / Self Care | Payer: Medicare Other | Source: Ambulatory Visit | Attending: Radiation Oncology | Admitting: Radiation Oncology

## 2014-10-12 ENCOUNTER — Encounter: Payer: Self-pay | Admitting: Radiation Oncology

## 2014-10-12 VITALS — BP 131/72 | HR 63 | Resp 16 | Wt 181.0 lb

## 2014-10-12 DIAGNOSIS — C61 Malignant neoplasm of prostate: Secondary | ICD-10-CM

## 2014-10-12 NOTE — Progress Notes (Signed)
Weight and vitals stable. Denies pain. Denies nocturia. Denies leakage or incontinence. Denies urgency. Reports his energy level is slowly improving. Denies diarrhea. Denies dysuria or hematuria.

## 2014-10-12 NOTE — Progress Notes (Signed)
  Radiation Oncology         [phone] ________________________________  Name: Wayne Kaufman  MRN: [id number]  Date: 10/12/2014  DOB: 08-Jul-1945  Follow-Up Visit Note  CC: Gennette Pac, MD  Raynelle Bring, MD  Diagnosis:   69 year old gentleman with state T2a adenocarcinoma of the prostate with a gleason score of 4_3 and a PSA of 3.55. Status post definitive radiation 07/11/2014-09/04/2014 up to 78 Gy.     ICD-9-CM ICD-10-CM   1. Malignant neoplasm of prostate 185 C61     Interval Since Last Radiation:  5  weeks  Narrative:  The patient returns today for routine follow-up.  Weight and vitals stable. Denies pain. Denies nocturia. Denies leakage or incontinence. Denies urgency. Reports his energy level is slowly improving. Denies diarrhea. Denies dysuria or hematuria.  "I had pressure" he vocalized while rubbing his lower stomach.                       ALLERGIES:  is allergic to amoxicillin and codeine.  Meds: Current Outpatient Prescriptions  Medication Sig Dispense Refill  . amLODipine (NORVASC) 2.5 MG tablet TAKE 1 TABLET (2.5 MG TOTAL) BY MOUTH DAILY. 90 tablet 3  . aspirin 81 MG tablet Take 81 mg by mouth daily.    Marland Kitchen losartan (COZAAR) 100 MG tablet TAKE ONE TABLET BY MOUTH ONCE DAILY 90 tablet 1  . Multiple Vitamin (MULTIVITAMIN) tablet Take 1 tablet by mouth daily.     No current facility-administered medications for this encounter.    Physical Findings: The patient is in no acute distress. Patient is alert and oriented.  weight is 181 lb (82.101 kg). His blood pressure is 131/72 and his pulse is 63. His respiration is 16. .  No significant changes.   Impression:  The patient is recovering from the effects of radiation.   Plan:  He will continue to follow-up with urology for ongoing PSA determinations.  I will look forward to following his response through their correspondence, and be happy to participate in care if clinically indicated.  I talked to the patient  about what to expect in the future, including his risk for erectile dysfunction and rectal bleeding.  I encouraged him to call or return to the office if he has any question about his previous radiation or possible radiation effects.  He was comfortable with this plan.    This document serves as a record of services personally performed by Tyler Pita, MD. It was created on his behalf by Lenn Cal, a trained medical scribe. The creation of this record is based on the scribe's personal observations and the provider's statements to them. This document has been checked and approved by the attending provider.   _____________________________________  Sheral Apley. Tammi Klippel, M.D.

## 2014-11-13 ENCOUNTER — Other Ambulatory Visit: Payer: Self-pay

## 2014-12-04 ENCOUNTER — Encounter: Payer: Self-pay | Admitting: Medical Oncology

## 2014-12-04 NOTE — Progress Notes (Signed)
Oncology Nurse Navigator Documentation  Oncology Nurse Navigator Flowsheets 11/27/2014  Navigator Encounter Type Telephone;3 month- Wayne Kaufman is doing well. He is 3 months post radiation. He states that he is slowly getting his energy back. No issues with urination. I asked him to call me with any questions or concerns.

## 2014-12-19 ENCOUNTER — Encounter: Payer: Self-pay | Admitting: Internal Medicine

## 2015-02-04 ENCOUNTER — Other Ambulatory Visit: Payer: Self-pay | Admitting: Interventional Cardiology

## 2015-03-27 ENCOUNTER — Other Ambulatory Visit: Payer: Self-pay | Admitting: Interventional Cardiology

## 2015-03-28 ENCOUNTER — Other Ambulatory Visit: Payer: Self-pay | Admitting: Interventional Cardiology

## 2015-06-06 ENCOUNTER — Telehealth: Payer: Self-pay | Admitting: Adult Health

## 2015-06-06 NOTE — Telephone Encounter (Signed)
I attempted to reach Wayne Kaufman to schedule his survivorship visit after completing treatment for prostate cancer.  I left him a voicemail asking that he return my call when he was able.  I left my direct office number.  Awaiting return call.   Mike Craze, NP [place] [phone]

## 2015-06-26 ENCOUNTER — Other Ambulatory Visit: Payer: Self-pay | Admitting: Interventional Cardiology

## 2015-06-26 MED ORDER — AMLODIPINE BESYLATE 2.5 MG PO TABS: ORAL_TABLET | ORAL | Status: DC

## 2015-06-29 ENCOUNTER — Other Ambulatory Visit: Payer: Self-pay | Admitting: Interventional Cardiology

## 2015-06-29 MED ORDER — AMLODIPINE BESYLATE 2.5 MG PO TABS: ORAL_TABLET | ORAL | Status: DC

## 2015-08-14 ENCOUNTER — Other Ambulatory Visit: Payer: Self-pay | Admitting: *Deleted

## 2015-08-14 MED ORDER — LOSARTAN POTASSIUM 100 MG PO TABS: 100.0000 mg | ORAL_TABLET | Freq: Every day | ORAL | Status: DC

## 2015-09-19 ENCOUNTER — Ambulatory Visit (INDEPENDENT_AMBULATORY_CARE_PROVIDER_SITE_OTHER): Payer: Medicare Other | Admitting: Interventional Cardiology

## 2015-09-19 ENCOUNTER — Encounter: Payer: Self-pay | Admitting: Interventional Cardiology

## 2015-09-19 VITALS — BP 150/90 | HR 64 | Ht 72.0 in | Wt 183.0 lb

## 2015-09-19 DIAGNOSIS — I059 Rheumatic mitral valve disease, unspecified: Secondary | ICD-10-CM | POA: Diagnosis not present

## 2015-09-19 DIAGNOSIS — I10 Essential (primary) hypertension: Secondary | ICD-10-CM

## 2015-09-19 MED ORDER — AMLODIPINE BESYLATE 2.5 MG PO TABS: ORAL_TABLET | ORAL | Status: DC

## 2015-09-19 MED ORDER — LOSARTAN POTASSIUM 100 MG PO TABS: 100.0000 mg | ORAL_TABLET | Freq: Every day | ORAL | Status: DC

## 2015-09-19 NOTE — Patient Instructions (Signed)
**Note De-identified Neil Brickell Obfuscation** Medication Instructions:  Same-no changes  Labwork: None  Testing/Procedures: None  Follow-Up: Your physician wants you to follow-up in: 1 year.You will receive a reminder letter in the mail two months in advance. If you don't receive a letter, please call our office to schedule the follow-up appointment.   Any Other Special Instructions Will Be Listed Below (If Applicable).     If you need a refill on your cardiac medications before your next appointment, please call your pharmacy.   

## 2015-09-19 NOTE — Progress Notes (Signed)
Patient ID: Wayne Kaufman, male   DOB: 1946-03-15, 70 y.o.   MRN: [id number]     Cardiology Office Note   Date:  09/19/2015   ID:  NIKESH BUSBY, DOB [birth year], MRN [id number]  PCP:  Gennette Pac, MD    No chief complaint on file. f/u mitral valve repair   Wt Readings from Last 3 Encounters:  09/19/15 183 lb (83.008 kg)  10/12/14 181 lb (82.101 kg)  08/24/14 183 lb 4.8 oz (83.144 kg)       History of Present Illness: Wayne Kaufman is a 70 y.o. male  who had mitral valve repair in 03/2009. He has done well. He has no shortness of breath or angina. He did not have significant coronary artery disease at that time. 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 He did have a cough. He thinks it is related to his lisinopril. Switched to losartan. He also has had dry eyes, but this is better. He saw ophthalmologist but no solution was obtained. He was concerned that he had a reaction to amoxicillin. He has had flu like sx after his dental cleaning. They resolve by the next day. No rash. No throat swelling.  He feels that he is feeling some mild decrease in thinking and he attributes it to the amlodipine. His mood is a little depressed. It resolved when he stopped losartan for a few weeks. He restarted the medicine and his sx came back. He stopped again and sx resolved. Would not use high dose amlodipine in the future.  BP at home is better than it was today.  Treated for prostate cancer with radiation.  He did well.     Past Medical History  Diagnosis Date  . Broken shoulder 2002    and broken left pelvis  . S/P mitral valve repair   . History of echocardiogram     2011 showed trace mitral regurg, mitral annuloplasty ring noted, EF 50-55%. Need SBE prophylaxis   . Family history of colon cancer   . 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 Essential hypertension   . Low HDL (under 40)   . Prostate cancer ([place])   .  Fractured pelvis ([place]) 2002    fx shoulder and pelvis at the same time    Past Surgical History  Procedure Laterality Date  . Cardiac catheterization    . Prostate biopsy    . Mitral valve repair  2010     Current Outpatient Prescriptions  Medication Sig Dispense Refill  . amLODipine (NORVASC) 2.5 MG tablet TAKE 1 TABLET (2.5 MG TOTAL) BY MOUTH DAILY. 30 tablet 3  . aspirin 81 MG tablet Take 81 mg by mouth daily.    Marland Kitchen losartan (COZAAR) 100 MG tablet Take 1 tablet (100 mg total) by mouth daily. 30 tablet 1  . Multiple Vitamin (MULTIVITAMIN) tablet Take 1 tablet by mouth daily.     No current facility-administered medications for this visit.    Allergies:   Amoxicillin and Codeine    Social History:  The patient  reports that he has never smoked. He has never used smokeless tobacco. He reports that he does not drink alcohol or use illicit drugs.   Family History:  The patient's family history includes Cancer in his father and mother; Colon cancer in his father; Congenital heart disease in his sister; Heart failure in his mother. There is no history of Heart  attack or Stroke.    ROS:  Please see the history of present illness.   Otherwise, review of systems are positive for some erectile dysfunction- treated with Viagra.   All other systems are reviewed and negative.    PHYSICAL EXAM: VS:  BP 150/90 mmHg  Pulse 64  Ht 6' (1.829 m)  Wt 183 lb (83.008 kg)  BMI 24.81 kg/m2 , BMI Body mass index is 24.81 kg/(m^2). GEN: Well nourished, well developed, in no acute distress HEENT: normal Neck: no JVD, carotid bruits, or masses Cardiac: RRR; no murmurs, rubs, or gallops,no edema  Respiratory:  clear to auscultation bilaterally, normal work of breathing GI: soft, nontender, nondistended, + BS MS: no deformity or atrophy Skin: warm and dry, no rash Neuro:  Strength and sensation are intact Psych: euthymic mood, full affect   EKG:   The ekg ordered today demonstrates NSR, PVC,  no ST segment changes   Recent Labs: No results found for requested labs within last 365 days.   Lipid Panel No results found for: CHOL, TRIG, HDL, CHOLHDL, VLDL, LDLCALC, LDLDIRECT   Other studies Reviewed: Additional studies/ records that were reviewed today with results demonstrating: prior cath results reviewed.   ASSESSMENT AND PLAN:  1. Mitral regurgitation : Status post mitral valve repair. Continue SBE prophylaxis. No murmur on exam. Valve repair appears to be functioning well. No need for echocardiogram at this time. Now on clindmycin due to possible reaction to amoxicillin, for SBE prophylaxis. 2. HTN:  Seems to tolerate low dose amlodipine.  WOuld not use 10 mg dose.  Cough better on ARB. 3. Prostate CA: Treated with radiation.   Current medicines are reviewed at length with the patient today.  The patient concerns regarding his medicines were addressed.  The following changes have been made:  No change  Labs/ tests ordered today include:   Orders Placed This Encounter  Procedures  . EKG 12-Lead    Recommend 150 minutes/week of aerobic exercise Low fat, low carb, high fiber diet recommended  Disposition:   FU in 1 year   Teresita Madura., MD  09/19/2015 11:16 AM    [facility] [place], [place], [place]  [address] Phone: [phone]; Fax: [phone]

## 2016-09-18 NOTE — Progress Notes (Signed)
Patient ID: Wayne Kaufman, male   DOB: [birth year], 71 y.o.   MRN: [id number]     Cardiology Office Note   Date:  09/19/2016   ID:  Wayne Kaufman, DOB 1946/02/23, MRN [id number]  PCP:  Gennette Pac, MD    No chief complaint on file. f/u mitral valve repair   Wt Readings from Last 3 Encounters:  09/19/16 184 lb 6.4 oz (83.6 kg)  09/19/15 183 lb (83 kg)  10/12/14 181 lb (82.1 kg)       History of Present Illness: Wayne Kaufman is a 71 y.o. male  who had mitral valve repair in 03/2009. He has done well. He has no shortness of breath or angina. He did not have significant coronary artery disease at that time.   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 He was sedentary during the winter and has gained some weight.  Intolerance to amoxicillin. He has had flu like sx after his dental cleaning when he takes amoxicillin. These sx resolve by the next day. No rash. No throat swelling.  He switched to clindamycin.  Would not use high dose amlodipine in the future.  He fet some ental slowing in the past on higher dose amlodipine.  BP has been controlled for the most part.  He has had a few high readings, up to 016 systolic.  Treated for prostate cancer with radiation in the past, no surgery.  He did well. PSA staying low.  He was in [place] and has friends who were exposed to [employer].    Past Medical History:  Diagnosis Date  . Broken shoulder 2002   and broken left pelvis  . Essential hypertension   . Family history of colon cancer   . Fractured pelvis ([place]) 2002   fx shoulder and pelvis at the same time  . History of echocardiogram    2011 showed trace mitral regurg, mitral annuloplasty ring noted, EF 50-55%. Need SBE prophylaxis   . Low HDL (under 40)   . Prostate cancer ([place])   . S/P mitral valve repair   . 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 CARDIAC CATHETERIZATION    . MITRAL VALVE REPAIR  2010  . PROSTATE BIOPSY       Current Outpatient Prescriptions  Medication Sig Dispense Refill  . amLODipine (NORVASC) 2.5 MG tablet TAKE 1 TABLET (2.5 MG TOTAL) BY MOUTH DAILY. 90 tablet 3  . losartan (COZAAR) 100 MG tablet Take 1 tablet (100 mg total) by mouth daily. 90 tablet 3  . Multiple Vitamin (MULTIVITAMIN) tablet Take 1 tablet by mouth daily.    Marland Kitchen aspirin 81 MG tablet Take 81 mg by mouth daily.     No current facility-administered medications for this visit.     Allergies:   Amoxicillin and Codeine    Social History:  The patient  reports that he has never smoked. He has never used smokeless tobacco. He reports that he does not drink alcohol or use drugs.   Family History:  The patient's family history includes Cancer in his father and mother; Colon cancer in his father; Congenital heart disease in his sister; Heart failure in his mother.    ROS:  Please see the history of present illness.   Otherwise, review of systems are positive for some erectile dysfunction- treated with Viagra.  All other systems are reviewed and negative.    PHYSICAL EXAM: VS:  BP 120/82   Pulse 62   Ht 6' (1.829 m)   Wt 184 lb 6.4 oz (83.6 kg)   SpO2 97%   BMI 25.01 kg/m  , BMI Body mass index is 25.01 kg/m. GEN: Well nourished, well developed, in no acute distress  HEENT: normal  Neck: no JVD, carotid bruits, or masses Cardiac: RRR; no murmurs, rubs, or gallops,no edema  Respiratory:  clear to auscultation bilaterally, normal work of breathing GI: soft, nontender, nondistended, + BS MS: no deformity or atrophy  Skin: warm and dry, no rash Neuro:  Strength and sensation are intact Psych: euthymic mood, full affect   EKG:   The ekg ordered today demonstrates NSR, PVC, no ST segment changes- no change from prior   Recent Labs: No results found for requested labs within last 8760 hours.   Lipid  Panel No results found for: CHOL, TRIG, HDL, CHOLHDL, VLDL, LDLCALC, LDLDIRECT   Other studies Reviewed: Additional studies/ records that were reviewed today with results demonstrating: prior cath results reviewed.  Last echo in 2011.   ASSESSMENT AND PLAN:  1. Mitral regurgitation : Status post mitral valve repair. Continue SBE prophylaxis- now using clindamycin. No murmur on exam. Valve repair appears to be functioning well. No need for echocardiogram at this time. 2. HTN:  Seems to tolerate low dose amlodipine.  WOuld not use 10 mg dose.  Cough better on ARB. Well cotrolled. 3. Prostate CA: Treated with radiation.  Doing well.   4. Increase exercise.  Try to maintain exercise in the winter time.    Current medicines are reviewed at length with the patient today.  The patient concerns regarding his medicines were addressed.  The following changes have been made:  No change  Labs/ tests ordered today include:   No orders of the defined types were placed in this encounter.   Recommend 150 minutes/week of aerobic exercise Low fat, low carb, high fiber diet recommended  Disposition:   FU in 1 year   Signed, Larae Grooms, MD  09/19/2016 9:52 AM    [facility] [place], [place], [place]  [address] Phone: [phone]; Fax: [phone]

## 2016-09-19 ENCOUNTER — Encounter: Payer: Self-pay | Admitting: Interventional Cardiology

## 2016-09-19 ENCOUNTER — Ambulatory Visit (INDEPENDENT_AMBULATORY_CARE_PROVIDER_SITE_OTHER): Payer: Medicare Other | Admitting: Interventional Cardiology

## 2016-09-19 VITALS — BP 120/82 | HR 62 | Ht 72.0 in | Wt 184.4 lb

## 2016-09-19 DIAGNOSIS — I10 Essential (primary) hypertension: Secondary | ICD-10-CM | POA: Diagnosis not present

## 2016-09-19 DIAGNOSIS — I059 Rheumatic mitral valve disease, unspecified: Secondary | ICD-10-CM

## 2016-09-19 NOTE — Patient Instructions (Signed)

## 2016-10-14 ENCOUNTER — Other Ambulatory Visit: Payer: Self-pay | Admitting: Interventional Cardiology

## 2017-10-12 ENCOUNTER — Other Ambulatory Visit: Payer: Self-pay | Admitting: Interventional Cardiology

## 2017-11-11 NOTE — Progress Notes (Signed)
Cardiology Office Note   Date:  11/12/2017   ID:  Wayne Kaufman, DOB 06-10-1945, MRN [id number]  PCP:  Wayne Fess, MD    No chief complaint on file.  Mitral valve regurgitation  Wt Readings from Last 3 Encounters:  11/12/17 190 lb 6.4 oz (86.4 kg)  09/19/16 184 lb 6.4 oz (83.6 kg)  09/19/15 183 lb (83 kg)       History of Present Illness: Wayne Kaufman is a 72 y.o. male   who had mitral valve repair in 03/2009. He has done well. He has no shortness of breath or angina. He did not have significant coronary artery disease at that time.   Intolerance to amoxicillin. He has had flu like sx after his dental cleaning when he takes amoxicillin. These sx resolve by the next day. No rash. No throat swelling.  He switched to clindamycin.  Would not use high dose amlodipine in the future.  He felt some mental slowing in the past on higher dose amlodipine.  Treated for prostate cancer with radiation in the past, no surgery.  He did well. PSA staying low.  He was in [place] and has friends who were exposed to [employer].   Past Medical History:  Diagnosis Date  . Broken shoulder 2002   and broken left pelvis  . Essential hypertension   . Family history of colon cancer   . Fractured pelvis ([place]) 2002   fx shoulder and pelvis at the same time  . History of echocardiogram    2011 showed trace mitral regurg, mitral annuloplasty ring noted, EF 50-55%. Need SBE prophylaxis   . Low HDL (under 40)   . Prostate cancer ([place])   . S/P mitral valve repair   . 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 CARDIAC CATHETERIZATION    . MITRAL VALVE REPAIR  2010  . PROSTATE BIOPSY       Current Outpatient Medications  Medication Sig Dispense Refill  . amLODipine (NORVASC) 2.5 MG tablet TAKE ONE TABLET BY MOUTH DAILY 90 tablet 0  . losartan (COZAAR) 100 MG tablet TAKE ONE TABLET BY MOUTH  DAILY 90 tablet 0   No current facility-administered medications for this visit.     Allergies:   Amoxicillin and Codeine    Social History:  The patient  reports that he has never smoked. He has never used smokeless tobacco. He reports that he does not drink alcohol or use drugs.   Family History:  The patient's family history includes Cancer in his father and mother; Colon cancer in his father; Congenital heart disease in his sister; Heart failure in his mother.    ROS:  Please see the history of present illness.   Otherwise, review of systems are positive for occasional joint pains.   All other systems are reviewed and negative.    PHYSICAL EXAM: VS:  BP 140/82   Pulse (!) 58   Ht 6' (1.829 m)   Wt 190 lb 6.4 oz (86.4 kg)   SpO2 97%   BMI 25.82 kg/m  , BMI Body mass index is 25.82 kg/m. GEN: Well nourished, well developed, in no acute distress  HEENT: normal  Neck: no JVD, carotid bruits, or masses Cardiac: RRR; no murmurs, rubs, or gallops,no edema ; 2+ PT pulses Respiratory:  clear to auscultation bilaterally, normal work of breathing GI: soft, nontender,  nondistended, + BS MS: no deformity or atrophy  Skin: warm and dry, no rash Neuro:  Strength and sensation are intact Psych: euthymic mood, full affect   EKG:   The ekg ordered today demonstrates sinus bradycardia, no ST changes   Recent Labs: No results found for requested labs within last 8760 hours.   Lipid Panel No results found for: CHOL, TRIG, HDL, CHOLHDL, VLDL, LDLCALC, LDLDIRECT   Other studies Reviewed: Additional studies/ records that were reviewed today with results demonstrating:LDL 33 in 10/18 .   ASSESSMENT AND PLAN:  1. Mitral regurgitation: Valve functions well.  Continue SBE prophylaxis. No signs of CHF.  2. HTN: 140/66 on my recheck. Continue current meds.  Continue to monitor.  3. Increase exercise, particularly in bad weather.     Current medicines are reviewed at length with the  patient today.  The patient concerns regarding his medicines were addressed.  The following changes have been made:  No change  Labs/ tests ordered today include:  No orders of the defined types were placed in this encounter.   Recommend 150 minutes/week of aerobic exercise Low fat, low carb, high fiber diet recommended  Disposition:   FU in 1 year   Signed, Larae Grooms, MD  11/12/2017 10:09 AM    [facility] [place], [place], [place]  [address] Phone: [phone]; Fax: [phone]

## 2017-11-12 ENCOUNTER — Ambulatory Visit: Payer: Medicare Other | Admitting: Interventional Cardiology

## 2017-11-12 ENCOUNTER — Encounter: Payer: Self-pay | Admitting: Interventional Cardiology

## 2017-11-12 VITALS — BP 140/82 | HR 58 | Ht 72.0 in | Wt 190.4 lb

## 2017-11-12 DIAGNOSIS — I059 Rheumatic mitral valve disease, unspecified: Secondary | ICD-10-CM | POA: Diagnosis not present

## 2017-11-12 DIAGNOSIS — I10 Essential (primary) hypertension: Secondary | ICD-10-CM | POA: Diagnosis not present

## 2017-11-12 MED ORDER — CLINDAMYCIN HCL 300 MG PO CAPS: ORAL_CAPSULE | ORAL | 3 refills | Status: DC

## 2017-11-12 MED ORDER — AMLODIPINE BESYLATE 2.5 MG PO TABS: 2.5000 mg | ORAL_TABLET | Freq: Every day | ORAL | 3 refills | Status: DC

## 2017-11-12 MED ORDER — LOSARTAN POTASSIUM 100 MG PO TABS: 100.0000 mg | ORAL_TABLET | Freq: Every day | ORAL | 3 refills | Status: DC

## 2017-11-12 NOTE — Patient Instructions (Signed)
Medication Instructions:  Your physician recommends that you continue on your current medications as directed. Please refer to the Current Medication list given to you today.   Labwork: None ordered  Testing/Procedures: None ordered  Follow-Up: Your physician wants you to follow-up in: 1 year with Dr. Irish Lack. You will receive a reminder letter in the mail two months in advance. If you don't receive a letter, please call our office to schedule the follow-up appointment.   Any Other Special Instructions Will Be Listed Below (If Applicable).  Your physician discussed the importance of taking an antibiotic prior to any dental, gastrointestinal, genitourinary procedures to prevent damage to the heart valves from infection. You were given a prescription for an antibiotic based on current SBE prophylaxis guidelines.   If you need a refill on your cardiac medications before your next appointment, please call your pharmacy.

## 2017-11-12 NOTE — Addendum Note (Signed)
Addended by: Drue Novel I on: 11/12/2017 10:47 AM   Modules accepted: Orders

## 2017-11-18 ENCOUNTER — Encounter: Payer: Self-pay | Admitting: Interventional Cardiology

## 2018-11-04 ENCOUNTER — Ambulatory Visit: Payer: Medicare Other | Admitting: Interventional Cardiology

## 2018-11-10 ENCOUNTER — Telehealth: Payer: Self-pay

## 2018-11-10 NOTE — Telephone Encounter (Signed)

## 2018-11-14 NOTE — Progress Notes (Signed)
Cardiology Office Note   Date:  11/15/2018   ID:  Wayne Kaufman, DOB [birth year], MRN [id number]  PCP:  Hulan Fess, MD    No chief complaint on file.  Mitral valve disorder  Wt Readings from Last 3 Encounters:  11/15/18 184 lb 6.4 oz (83.6 kg)  11/12/17 190 lb 6.4 oz (86.4 kg)  09/19/16 184 lb 6.4 oz (83.6 kg)       History of Present Illness: Wayne Kaufman is a 73 y.o. male  who had mitral valve repair in 03/2009. He has done well. He has no shortness of breath or angina. He did not have significant coronary artery disease at that time.   Intolerance toamoxicillin. He has had flu like sx after his dental cleaning when he takes amoxicillin. These sxresolve by the next day. No rash. No throat swelling. He switched to clindamycin.  Would not use high dose amlodipine in the future.He felt some mental slowing in the past on higher dose amlodipine.  Treated for prostate cancer with radiationin the past, no surgery. He did well. PSA staying low.  He was in [place] and has friends who were exposed to [employer].  Wife with RA.  He has been staying home mostly.  His friend Bethanne Ginger, who had been my patient in the past, passed away in 2017/08/21.   Denies : Chest pain. Dizziness. Leg edema. Nitroglycerin use. Orthopnea. Palpitations. Paroxysmal nocturnal dyspnea. Shortness of breath. Syncope.   He continues to walk regularly and do a lot of yard work as well.   BP at home and at PMD has been in the normal range.      Past Medical History:  Diagnosis Date  . Broken shoulder August 21, 2000   and broken left pelvis  . Essential hypertension   . Family history of colon cancer   . Fractured pelvis ([place]) August 21, 2000   fx shoulder and pelvis at the same time  . History of echocardiogram    2009/08/21 showed trace mitral regurg, mitral annuloplasty ring noted, EF 50-55%. Need SBE prophylaxis   . Low HDL (under 40)   . Prostate cancer ([place])   . S/P mitral valve repair   . 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 CARDIAC CATHETERIZATION    . MITRAL VALVE REPAIR  08/21/2008  . PROSTATE BIOPSY       Current Outpatient Medications  Medication Sig Dispense Refill  . amLODipine (NORVASC) 2.5 MG tablet Take 1 tablet (2.5 mg total) by mouth daily. 90 tablet 3  . clindamycin (CLEOCIN) 300 MG capsule Take 2 capsules (600 mg total) 1 hour prior to any dental procedures 2 capsule 3  . losartan (COZAAR) 100 MG tablet Take 1 tablet (100 mg total) by mouth daily. 90 tablet 3   No current facility-administered medications for this visit.     Allergies:   Amoxicillin and Codeine    Social History:  The patient  reports that he has never smoked. He has never used smokeless tobacco. He reports that he does not drink alcohol or use drugs.   Family History:  The patient's family history includes Cancer in his father and mother; Colon cancer in his father; Congenital heart disease in his sister; Heart failure in his mother.    ROS:  Please see the history of present illness.   Otherwise, review of systems are positive for  needs antibiotics for dental procedures.   All other systems are reviewed and negative.    PHYSICAL EXAM: VS:  BP (!) 152/82   Pulse 62   Ht 6' (1.829 m)   Wt 184 lb 6.4 oz (83.6 kg)   SpO2 98%   BMI 25.01 kg/m  , BMI Body mass index is 25.01 kg/m. GEN: Well nourished, well developed, in no acute distress  HEENT: normal  Neck: no JVD, carotid bruits, or masses Cardiac: RRR; no murmurs, rubs, or gallops,no edema  Respiratory:  clear to auscultation bilaterally, normal work of breathing GI: soft, nontender, nondistended, + BS MS: no deformity or atrophy  Skin: warm and dry, no rash Neuro:  Strength and sensation are intact Psych: euthymic mood, full affect   EKG:   The ekg ordered today demonstrates NSR, no ST changes   Recent Labs: No results found for  requested labs within last 8760 hours.   Lipid Panel No results found for: CHOL, TRIG, HDL, CHOLHDL, VLDL, LDLCALC, LDLDIRECT   Other studies Reviewed: Additional studies/ records that were reviewed today with results demonstrating: Labs reviewed in 10/19..   ASSESSMENT AND PLAN:  1. Mitral regurgitation: No CHF sx.  Needs refills for clindamycin.  Appears euvolemic. 2. HTN: Controlled for the most part.  Last reading before today was 128/60 3. LDL 99 in 10/19.  Healthy diet.  COntinue regular exercise.   Current medicines are reviewed at length with the patient today.  The patient concerns regarding his medicines were addressed.  The following changes have been made:  No change  Labs/ tests ordered today include:  No orders of the defined types were placed in this encounter.   Recommend 150 minutes/week of aerobic exercise Low fat, low carb, high fiber diet recommended  Disposition:   FU in 1 year   Signed, Larae Grooms, MD  11/15/2018 10:15 AM    [facility] [place], [place], [place]  [address] Phone: [phone]; Fax: [phone]

## 2018-11-15 ENCOUNTER — Ambulatory Visit (INDEPENDENT_AMBULATORY_CARE_PROVIDER_SITE_OTHER): Payer: Medicare Other | Admitting: Interventional Cardiology

## 2018-11-15 ENCOUNTER — Other Ambulatory Visit: Payer: Self-pay

## 2018-11-15 ENCOUNTER — Encounter: Payer: Self-pay | Admitting: Interventional Cardiology

## 2018-11-15 VITALS — BP 152/82 | HR 62 | Ht 72.0 in | Wt 184.4 lb

## 2018-11-15 DIAGNOSIS — I059 Rheumatic mitral valve disease, unspecified: Secondary | ICD-10-CM

## 2018-11-15 DIAGNOSIS — I10 Essential (primary) hypertension: Secondary | ICD-10-CM

## 2018-11-15 MED ORDER — LOSARTAN POTASSIUM 100 MG PO TABS: 100.0000 mg | ORAL_TABLET | Freq: Every day | ORAL | 3 refills | Status: DC

## 2018-11-15 MED ORDER — AMLODIPINE BESYLATE 2.5 MG PO TABS: 2.5000 mg | ORAL_TABLET | Freq: Every day | ORAL | 3 refills | Status: DC

## 2018-11-15 MED ORDER — CLINDAMYCIN HCL 300 MG PO CAPS: ORAL_CAPSULE | ORAL | 3 refills | Status: DC

## 2018-11-15 NOTE — Patient Instructions (Signed)
Medication Instructions:  Your physician recommends that you continue on your current medications as directed. Please refer to the Current Medication list given to you today.  If you need a refill on your cardiac medications before your next appointment, please call your pharmacy.   Lab work:  None ordered today   Testing/Procedures:  None ordered today  Follow-Up: At [facility], you and your health needs are our priority.  As part of our continuing mission to provide you with exceptional heart care, we have created designated Provider Care Teams.  These Care Teams include your primary Cardiologist (physician) and Advanced Practice Providers (APPs -  Physician Assistants and Nurse Practitioners) who all work together to provide you with the care you need, when you need it. You will need a follow up appointment in 12 months.  Please call our office 2 months in advance to schedule this appointment.  You may see Larae Grooms, MD or one of the following Advanced Practice Providers on your designated Care Team:   [place], PA-C Melina Copa, PA-C . Ermalinda Barrios, PA-C

## 2019-05-20 HISTORY — PX: COLONOSCOPY: SHX174

## 2019-06-09 ENCOUNTER — Ambulatory Visit: Payer: Medicare Other | Attending: Internal Medicine

## 2019-06-09 DIAGNOSIS — Z23 Encounter for immunization: Secondary | ICD-10-CM | POA: Insufficient documentation

## 2019-06-09 NOTE — Progress Notes (Signed)
   Covid-19 Vaccination Clinic  Name:  Wayne Kaufman    MRN: [id number] DOB: [birth year]  06/09/2019  Wayne Kaufman was observed post Covid-19 immunization for 15 minutes without incidence. He was provided with Vaccine Information Sheet and instruction to access the V-Safe system.   Wayne Kaufman was instructed to call 911 with any severe reactions post vaccine: Marland Kitchen Difficulty breathing  . Swelling of your face and throat  . A fast heartbeat  . A bad rash all over your body  . Dizziness and weakness    Immunizations Administered    Name Date Dose VIS Date Route   Pfizer COVID-19 Vaccine 06/09/2019 11:53 AM 0.3 mL 04/29/2019 Intramuscular   Manufacturer: [place]   Lot: [id number]   [place]: [id number]

## 2019-06-30 ENCOUNTER — Ambulatory Visit: Payer: Medicare Other | Attending: Internal Medicine

## 2019-06-30 DIAGNOSIS — Z23 Encounter for immunization: Secondary | ICD-10-CM | POA: Insufficient documentation

## 2019-06-30 NOTE — Progress Notes (Signed)
   Covid-19 Vaccination Clinic  Name:  Wayne Kaufman    MRN: [id number] DOB: 08/21/1945  06/30/2019  Mr. Wayne Kaufman was observed post Covid-19 immunization for 15 minutes without incidence. He was provided with Vaccine Information Sheet and instruction to access the V-Safe system.   Mr. Wayne Kaufman was instructed to call 911 with any severe reactions post vaccine: Marland Kitchen Difficulty breathing  . Swelling of your face and throat  . A fast heartbeat  . A bad rash all over your body  . Dizziness and weakness    Immunizations Administered    Name Date Dose VIS Date Route   Pfizer COVID-19 Vaccine 06/30/2019 12:34 PM 0.3 mL 04/29/2019 Intramuscular   Manufacturer: [employer]   Lot: [id number]   [place]: [place]

## 2019-07-08 ENCOUNTER — Ambulatory Visit: Payer: Medicare Other

## 2019-11-22 NOTE — Progress Notes (Addendum)
Cardiology Office Note   Date:  11/24/2019   ID:  Wayne Kaufman, DOB 18-Feb-1946, MRN [id number]  PCP:  Hulan Fess, MD    No chief complaint on file.  S/p MVR  Wt Readings from Last 3 Encounters:  11/24/19 192 lb 12.8 oz (87.5 kg)  11/15/18 184 lb 6.4 oz (83.6 kg)  11/12/17 190 lb 6.4 oz (86.4 kg)       History of Present Illness: Wayne Kaufman is a 74 y.o. male  who had mitral valve repair in 03/2009. He has done well. He has no shortness of breath or angina. He did not have significant coronary artery disease at that time.   Intolerance toamoxicillin. He has had flu like sx after his dental cleaning when he takes amoxicillin. These sxresolve by the next day. No rash. No throat swelling. He switched to clindamycin.  Would not use high dose amlodipine in the future.He felt somemental slowing in the past on higher dose amlodipine.  Treated for prostate cancer with radiationin the past, no surgery. He did well. PSA staying low.  He was in [place] and has friends who were exposed to [employer].  His friend Bethanne Ginger, who had been my patient in the past, passed away in 08/31/17.   Wife with RA.  He had been staying home mostly during [place] in 2018-09-01.  Since the last visit, got his COVID vaccines.  Never got sick.  His friend had a bad experience at the [facility] ER- ultimately diagnosed with opsoclonus-myoclonus.     Denies : Chest pain. Dizziness. Leg edema. Nitroglycerin use. Orthopnea. Palpitations. Paroxysmal nocturnal dyspnea. Shortness of breath. Syncope.   Still active in the yard.  That is most strenuous exercise.     Past Medical History:  Diagnosis Date  . Broken shoulder 2000-08-31   and broken left pelvis  . Essential hypertension   . Family history of colon cancer   . Fractured pelvis ([place]) 08/31/2000   fx shoulder and pelvis at the same time  . History of echocardiogram    2009/08/31 showed trace mitral regurg, mitral annuloplasty ring noted, EF 50-55%. Need  SBE prophylaxis   . Low HDL (under 40)   . Prostate cancer ([place])   . S/P mitral valve repair   . 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 CARDIAC CATHETERIZATION    . MITRAL VALVE REPAIR  2008-08-31  . PROSTATE BIOPSY       Current Outpatient Medications  Medication Sig Dispense Refill  . amLODipine (NORVASC) 2.5 MG tablet Take 1 tablet (2.5 mg total) by mouth daily. 90 tablet 3  . clindamycin (CLEOCIN) 300 MG capsule Take 2 capsules (600 mg total) 1 hour prior to any dental procedures 2 capsule 3  . losartan (COZAAR) 100 MG tablet Take 1 tablet (100 mg total) by mouth daily. 90 tablet 3   No current facility-administered medications for this visit.    Allergies:   Amoxicillin and Codeine    Social History:  The patient  reports that he has never smoked. He has never used smokeless tobacco. He reports that he does not drink alcohol and does not use drugs.   Family History:  The patient's family history includes Cancer in his father and mother; Colon cancer in his father; Congenital heart disease in his sister; Heart failure in his mother.    ROS:  Please see the history of present illness.   Otherwise, review of systems are positive for rare joint pains.   All other systems are reviewed and negative.    PHYSICAL EXAM: VS:  BP 128/82   Pulse (!) 56   Ht 6' (1.829 m)   Wt 192 lb 12.8 oz (87.5 kg)   SpO2 98%   BMI 26.15 kg/m  , BMI Body mass index is 26.15 kg/m. GEN: Well nourished, well developed, in no acute distress  HEENT: normal  Neck: no JVD, carotid bruits, or masses Cardiac: RRR; no murmurs, rubs, or gallops,no edema  Respiratory:  clear to auscultation bilaterally, normal work of breathing GI: soft, nontender, nondistended, + BS MS: no deformity or atrophy  Skin: warm and dry, no rash Neuro:  Strength and sensation are intact Psych: euthymic mood, full affect   EKG:    The ekg ordered today demonstrates NSR, no ST changes   Recent Labs: No results found for requested labs within last 8760 hours.   Lipid Panel No results found for: CHOL, TRIG, HDL, CHOLHDL, VLDL, LDLCALC, LDLDIRECT   Other studies Reviewed: Additional studies/ records that were reviewed today with results demonstrating: Jan 2021 labs reviewed; LDL 102.     ASSESSMENT AND PLAN:  1. Mitral regurgitation: Status post mitral valve repair in 2010.  No murmur on exam.  Continue SBE prophylaxis given prior surgery.  Refill Clindamycin.  Visits dentist 3x/year.  2. Hypertension: Avoid sodium.  Exercise recommendations as noted below.  The current medical regimen is effective;  continue present plan and medications.   3. Whole food, plant-based diet recommended.  He does enjoy some meat.  I encouraged moderation.  Lipids well controlled.    Current medicines are reviewed at length with the patient today.  The patient concerns regarding his medicines were addressed.  The following changes have been made:  No change  Labs/ tests ordered today include:  No orders of the defined types were placed in this encounter.   Recommend 150 minutes/week of aerobic exercise Low fat, low carb, high fiber diet recommended  Disposition:   FU in 1 year   Signed, Larae Grooms, MD  11/24/2019 9:51 AM    [facility] [place], [place], [place]  [address] Phone: [phone]; Fax: [phone]

## 2019-11-24 ENCOUNTER — Other Ambulatory Visit: Payer: Self-pay

## 2019-11-24 ENCOUNTER — Encounter: Payer: Self-pay | Admitting: Interventional Cardiology

## 2019-11-24 ENCOUNTER — Ambulatory Visit: Payer: Medicare Other | Admitting: Interventional Cardiology

## 2019-11-24 VITALS — BP 128/82 | HR 56 | Ht 72.0 in | Wt 192.8 lb

## 2019-11-24 DIAGNOSIS — I059 Rheumatic mitral valve disease, unspecified: Secondary | ICD-10-CM

## 2019-11-24 DIAGNOSIS — I10 Essential (primary) hypertension: Secondary | ICD-10-CM | POA: Diagnosis not present

## 2019-11-24 DIAGNOSIS — Z298 Encounter for other specified prophylactic measures: Secondary | ICD-10-CM | POA: Diagnosis not present

## 2019-11-24 MED ORDER — CLINDAMYCIN HCL 300 MG PO CAPS: ORAL_CAPSULE | ORAL | 3 refills | Status: DC

## 2019-11-24 NOTE — Patient Instructions (Signed)
Medication Instructions:  Your physician recommends that you continue on your current medications as directed. Please refer to the Current Medication list given to you today. *If you need a refill on your cardiac medications before your next appointment, please call your pharmacy*   Your physician discussed the importance of taking an antibiotic prior to any dental, gastrointestinal, genitourinary procedures to prevent damage to the heart valves from infection. You were given a prescription for an antibiotic based on current SBE prophylaxis guidelines.   Lab Work: None  If you have labs (blood work) drawn today and your tests are completely normal, you will receive your results only by:  [place] (if you have MyChart) OR  A paper copy in the mail If you have any lab test that is abnormal or we need to change your treatment, we will call you to review the results.   Testing/Procedures: None   Follow-Up: At [facility], you and your health needs are our priority.  As part of our continuing mission to provide you with exceptional heart care, we have created designated Provider Care Teams.  These Care Teams include your primary Cardiologist (physician) and Advanced Practice Providers (APPs -  Physician Assistants and Nurse Practitioners) who all work together to provide you with the care you need, when you need it.  We recommend signing up for the patient portal called "MyChart".  Sign up information is provided on this After Visit Summary.  MyChart is used to connect with patients for Virtual Visits (Telemedicine).  Patients are able to view lab/test results, encounter notes, upcoming appointments, etc.  Non-urgent messages can be sent to your provider as well.   To learn more about what you can do with MyChart, go to [email or web address].    Your next appointment:   12 month(s)  The format for your next appointment:   In Person  Provider:   You may see Larae Grooms, MD or one of the following Advanced Practice Providers on your designated Care Team:    Melina Copa, PA-C  Ermalinda Barrios, PA-C    Other Instructions  High-Fiber Diet Fiber, also called dietary fiber, is a type of carbohydrate that is found in fruits, vegetables, whole grains, and beans. A high-fiber diet can have many health benefits. Your health care provider may recommend a high-fiber diet to help:  Prevent constipation. Fiber can make your bowel movements more regular.  Lower your cholesterol.  Relieve the following conditions: ? Swelling of veins in the anus (hemorrhoids). ? Swelling and irritation (inflammation) of specific areas of the digestive tract (uncomplicated diverticulosis). ? A problem of the large intestine (colon) that sometimes causes pain and diarrhea (irritable bowel syndrome, IBS).  Prevent overeating as part of a weight-loss plan.  Prevent heart disease, type 2 diabetes, and certain cancers. What is my plan? The recommended daily fiber intake in grams (g) includes:  38 g for men age 65 or younger.  30 g for men over age 34.  31 g for women age 39 or younger.  21 g for women over age 3. You can get the recommended daily intake of dietary fiber by:  Eating a variety of fruits, vegetables, grains, and beans.  Taking a fiber supplement, if it is not possible to get enough fiber through your diet. What do I need to know about a high-fiber diet?  It is better to get fiber through food sources rather than from fiber supplements. There is not a lot of research about how effective  supplements are.  Always check the fiber content on the nutrition facts label of any prepackaged food. Look for foods that contain 5 g of fiber or more per serving.  Talk with a diet and nutrition specialist (dietitian) if you have questions about specific foods that are recommended or not recommended for your medical condition, especially if those foods are not listed  below.  Gradually increase how much fiber you consume. If you increase your intake of dietary fiber too quickly, you may have bloating, cramping, or gas.  Drink plenty of water. Water helps you to digest fiber. What are tips for following this plan?  Eat a wide variety of high-fiber foods.  Make sure that half of the grains that you eat each day are whole grains.  Eat breads and cereals that are made with whole-grain flour instead of refined flour or white flour.  Eat brown rice, bulgur wheat, or millet instead of white rice.  Start the day with a breakfast that is high in fiber, such as a cereal that contains 5 g of fiber or more per serving.  Use beans in place of meat in soups, salads, and pasta dishes.  Eat high-fiber snacks, such as berries, raw vegetables, nuts, and popcorn.  Choose whole fruits and vegetables instead of processed forms like juice or sauce. What foods can I eat?  Fruits Berries. Pears. Apples. Oranges. Avocado. Prunes and raisins. Dried figs. Vegetables Sweet potatoes. Spinach. Kale. Artichokes. Cabbage. Broccoli. Cauliflower. Green peas. Carrots. Squash. Grains Whole-grain breads. Multigrain cereal. Oats and oatmeal. Brown rice. Barley. Bulgur wheat. Alpine. Quinoa. Bran muffins. Popcorn. Rye wafer crackers. Meats and other proteins Navy, kidney, and pinto beans. Soybeans. Split peas. Lentils. Nuts and seeds. Dairy Fiber-fortified yogurt. Beverages Fiber-fortified soy milk. Fiber-fortified orange juice. Other foods Fiber bars. The items listed above may not be a complete list of recommended foods and beverages. Contact a dietitian for more options. What foods are not recommended? Fruits Fruit juice. Cooked, strained fruit. Vegetables Fried potatoes. Canned vegetables. Well-cooked vegetables. Grains White bread. Pasta made with refined flour. White rice. Meats and other proteins Fatty cuts of meat. Fried chicken or fried fish. Dairy Milk.  Yogurt. Cream cheese. Sour cream. Fats and oils Butters. Beverages Soft drinks. Other foods Cakes and pastries. The items listed above may not be a complete list of foods and beverages to avoid. Contact a dietitian for more information. Summary  Fiber is a type of carbohydrate. It is found in fruits, vegetables, whole grains, and beans.  There are many health benefits of eating a high-fiber diet, such as preventing constipation, lowering blood cholesterol, helping with weight loss, and reducing your risk of heart disease, diabetes, and certain cancers.  Gradually increase your intake of fiber. Increasing too fast can result in cramping, bloating, and gas. Drink plenty of water while you increase your fiber.  The best sources of fiber include whole fruits and vegetables, whole grains, nuts, seeds, and beans. This information is not intended to replace advice given to you by your health care provider. Make sure you discuss any questions you have with your health care provider. Document Revised: 03/09/2017 Document Reviewed: 03/09/2017 Elsevier Patient Education  2020 [employer].

## 2020-01-10 ENCOUNTER — Other Ambulatory Visit: Payer: Self-pay | Admitting: Interventional Cardiology

## 2020-05-25 DIAGNOSIS — D696 Thrombocytopenia, unspecified: Secondary | ICD-10-CM | POA: Diagnosis not present

## 2020-05-25 DIAGNOSIS — I10 Essential (primary) hypertension: Secondary | ICD-10-CM | POA: Diagnosis not present

## 2020-05-25 DIAGNOSIS — Z0000 Encounter for general adult medical examination without abnormal findings: Secondary | ICD-10-CM | POA: Diagnosis not present

## 2020-05-25 DIAGNOSIS — Z800 Family history of malignant neoplasm of digestive organs: Secondary | ICD-10-CM | POA: Diagnosis not present

## 2020-05-25 DIAGNOSIS — Z9889 Other specified postprocedural states: Secondary | ICD-10-CM | POA: Diagnosis not present

## 2020-10-09 DIAGNOSIS — H35033 Hypertensive retinopathy, bilateral: Secondary | ICD-10-CM | POA: Diagnosis not present

## 2020-12-11 ENCOUNTER — Other Ambulatory Visit: Payer: Self-pay | Admitting: *Deleted

## 2020-12-11 MED ORDER — CLINDAMYCIN HCL 300 MG PO CAPS: ORAL_CAPSULE | ORAL | 3 refills | Status: DC

## 2021-01-08 ENCOUNTER — Other Ambulatory Visit: Payer: Self-pay | Admitting: Interventional Cardiology

## 2021-04-29 NOTE — Progress Notes (Signed)
Cardiology Office Note   Date:  05/02/2021   ID:  Wayne Kaufman, DOB Oct 08, 1945, MRN [id number]  PCP:  Lawerance Cruel, MD    No chief complaint on file.  S/p MV repair  Wt Readings from Last 3 Encounters:  05/02/21 191 lb (86.6 kg)  11/24/19 192 lb 12.8 oz (87.5 kg)  11/15/18 184 lb 6.4 oz (83.6 kg)       History of Present Illness: [name] is a 75 y.o. male   who had mitral valve repair in 03/2009. He has done well. He has no shortness of breath or angina. He did not have significant coronary artery disease at that time.    Intolerance to amoxicillin. He has had flu like sx after his dental cleaning when he takes amoxicillin. These sx resolve by the next day. No rash. No throat swelling.  He switched to clindamycin.   Would not use high dose amlodipine in the future.  He felt some mental slowing in the past on higher dose amlodipine.   Treated for prostate cancer with radiation in the past, no surgery.  He did well. PSA staying low.   He was in [place] and has friends who were exposed to [employer].   His friend Bethanne Ginger, who had been my patient in the past, passed away in August 24, 2017.    Wife with RA.  He had been staying home mostly during [place] in 2018/08/25.   Got his COVID vaccines. In 2019/08/25, His friend had a bad experience at the [facility] ER- ultimately diagnosed with opsoclonus-myoclonus.    Goes to dentist 3x/year.  Foot pain in 08/24/2020- plantar fasciitis he thinks.    Past Medical History:  Diagnosis Date   Broken shoulder 08/24/00   and broken left pelvis   Essential hypertension    Family history of colon cancer    Fractured pelvis ([place]) 08-24-00   fx shoulder and pelvis at the same time   History of echocardiogram    08/24/09 showed trace mitral regurg, mitral annuloplasty ring noted, EF 50-55%. 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 Keep upcomming appointment. 90 tablet 1   clindamycin (CLEOCIN) 300 MG capsule Take 2 capsules (600 mg total) 1 hour prior to any dental procedures 2 capsule 3   losartan (COZAAR) 100 MG tablet Take 1 tablet (100 mg total) by mouth daily. Keep upcomming appointment. 90 tablet 1   No current facility-administered medications for this visit.    Allergies:   Amoxicillin and Codeine    Social History:  The patient  reports that he has never smoked. He has never used smokeless tobacco. He reports that he does not drink alcohol and does not use drugs.   Family History:  The patient's family history includes Cancer in his father and mother; Colon cancer in his father; Congenital heart disease in his sister; Heart failure in his mother.    ROS:  Please see the history of  present illness.   Otherwise, review of systems are positive for foot pain.   All other systems are reviewed and negative.    PHYSICAL EXAM: VS:  BP (!) 160/88   Pulse 66   Ht 6' (1.829 m)   Wt 191 lb (86.6 kg)   SpO2 96%   BMI 25.90 kg/m  , BMI Body mass index is 25.9 kg/m. GEN: Well nourished, well developed, in no acute distress HEENT: normal Neck: no JVD, carotid bruits, or masses Cardiac: RRR; no murmurs, rubs, or gallops,no edema  Respiratory:  clear to auscultation bilaterally, normal work of breathing GI: soft, nontender, nondistended, + BS MS: no deformity or atrophy Skin: warm and dry, no rash Neuro:  Strength and sensation are intact Psych: euthymic mood, full affect   EKG:   The ekg ordered today demonstrates NSR, no ST changes   Recent Labs: No results found for requested labs within last 8760 hours.    Lipid Panel No results found for: CHOL, TRIG, HDL, CHOLHDL, VLDL, LDLCALC, LDLDIRECT   Other studies Reviewed: Additional studies/ records that were reviewed today with results demonstrating: LDL 87, HDL 31 , TG 84 in 2022.   ASSESSMENT AND PLAN:  Mitral regurgitation: Status post mitral valve repair in 2010.  Continue SBE prophylaxis given prior valve surgery.  He needs regular dental visits for cleaning.  Going to the dentist 3 times a year.  We will call in appropriate antibiotics.  No signs of CHF.  No murmur on exam.  Valve repair appears to be functioning very well. Hypertension: Low salt diet.  Avoid processed foods.  Repeat BP 156/76.  Increase amlodipine to 5 mg. Check BP at home.  Send readings to [place] via MyChart.  He has had some other readings greater than 440 systolic.  He has taken some ibuprofen recently which may affect his blood pressure. Healthy, whole food, plant-based diet recommended.  Try to decrease red meat intake. Increase exercise level as foot allows.  May need to try water aerobics.   Current medicines are reviewed at length with the patient today.  The patient concerns regarding his medicines were addressed.  The following changes have been made:  No change  Labs/ tests ordered today include:  No orders of the defined types were placed in this encounter.   Recommend 150 minutes/week of aerobic exercise Low fat, low carb, high fiber diet recommended  Disposition:   FU in 1 year   Signed, Larae Grooms, MD  05/02/2021 8:53 AM    [facility] [place], [place],   [address] Phone: [phone]; Fax: [phone]

## 2021-05-02 ENCOUNTER — Ambulatory Visit: Payer: Medicare Other | Admitting: Interventional Cardiology

## 2021-05-02 ENCOUNTER — Other Ambulatory Visit: Payer: Self-pay

## 2021-05-02 ENCOUNTER — Encounter: Payer: Self-pay | Admitting: Interventional Cardiology

## 2021-05-02 VITALS — BP 156/76 | HR 66 | Ht 72.0 in | Wt 191.0 lb

## 2021-05-02 DIAGNOSIS — I059 Rheumatic mitral valve disease, unspecified: Secondary | ICD-10-CM | POA: Diagnosis not present

## 2021-05-02 DIAGNOSIS — M79673 Pain in unspecified foot: Secondary | ICD-10-CM

## 2021-05-02 DIAGNOSIS — I10 Essential (primary) hypertension: Secondary | ICD-10-CM

## 2021-05-02 DIAGNOSIS — Z298 Encounter for other specified prophylactic measures: Secondary | ICD-10-CM

## 2021-05-02 DIAGNOSIS — Z2989 Encounter for other specified prophylactic measures: Secondary | ICD-10-CM

## 2021-05-02 MED ORDER — AMLODIPINE BESYLATE 5 MG PO TABS: 5.0000 mg | ORAL_TABLET | Freq: Every day | ORAL | 3 refills | Status: DC

## 2021-05-02 MED ORDER — CLINDAMYCIN HCL 300 MG PO CAPS: ORAL_CAPSULE | ORAL | 3 refills | Status: DC

## 2021-05-02 NOTE — Patient Instructions (Addendum)
Medication Instructions:  Your physician has recommended you make the following change in your medication: Increase amlodipine to 5 mg by mouth daily  *If you need a refill on your cardiac medications before your next appointment, please call your pharmacy*   Lab Work: none If you have labs (blood work) drawn today and your tests are completely normal, you will receive your results only by: [place] (if you have MyChart) OR A paper copy in the mail If you have any lab test that is abnormal or we need to change your treatment, we will call you to review the results.   Testing/Procedures: none   Follow-Up: At [facility], you and your health needs are our priority.  As part of our continuing mission to provide you with exceptional heart care, we have created designated Provider Care Teams.  These Care Teams include your primary Cardiologist (physician) and Advanced Practice Providers (APPs -  Physician Assistants and Nurse Practitioners) who all work together to provide you with the care you need, when you need it.  We recommend signing up for the patient portal called "MyChart".  Sign up information is provided on this After Visit Summary.  MyChart is used to connect with patients for Virtual Visits (Telemedicine).  Patients are able to view lab/test results, encounter notes, upcoming appointments, etc.  Non-urgent messages can be sent to your provider as well.   To learn more about what you can do with MyChart, go to [email or web address].    Your next appointment:   12 month(s)  The format for your next appointment:   In Person  Provider:   Larae Grooms, MD     Other Instructions  Check BP at home and send in readings through my chart

## 2021-05-24 ENCOUNTER — Encounter: Payer: Self-pay | Admitting: Interventional Cardiology

## 2021-06-04 DIAGNOSIS — D696 Thrombocytopenia, unspecified: Secondary | ICD-10-CM | POA: Diagnosis not present

## 2021-06-04 DIAGNOSIS — I10 Essential (primary) hypertension: Secondary | ICD-10-CM | POA: Diagnosis not present

## 2021-06-04 DIAGNOSIS — Z0000 Encounter for general adult medical examination without abnormal findings: Secondary | ICD-10-CM | POA: Diagnosis not present

## 2021-06-04 DIAGNOSIS — E786 Lipoprotein deficiency: Secondary | ICD-10-CM | POA: Diagnosis not present

## 2021-07-08 ENCOUNTER — Other Ambulatory Visit: Payer: Self-pay | Admitting: Interventional Cardiology

## 2021-07-26 DIAGNOSIS — L821 Other seborrheic keratosis: Secondary | ICD-10-CM | POA: Diagnosis not present

## 2021-07-26 DIAGNOSIS — D0472 Carcinoma in situ of skin of left lower limb, including hip: Secondary | ICD-10-CM | POA: Diagnosis not present

## 2021-07-26 DIAGNOSIS — L814 Other melanin hyperpigmentation: Secondary | ICD-10-CM | POA: Diagnosis not present

## 2021-07-26 DIAGNOSIS — L570 Actinic keratosis: Secondary | ICD-10-CM | POA: Diagnosis not present

## 2021-08-08 DIAGNOSIS — D0472 Carcinoma in situ of skin of left lower limb, including hip: Secondary | ICD-10-CM | POA: Diagnosis not present

## 2021-12-27 DIAGNOSIS — R051 Acute cough: Secondary | ICD-10-CM | POA: Diagnosis not present

## 2021-12-27 DIAGNOSIS — Z20822 Contact with and (suspected) exposure to covid-19: Secondary | ICD-10-CM | POA: Diagnosis not present

## 2022-01-10 ENCOUNTER — Ambulatory Visit: Payer: Self-pay

## 2022-01-10 NOTE — Patient Outreach (Signed)
  Care Coordination   01/10/2022 Name: CANAAN HOLZER MRN: [id number] DOB: December 20, 1945   Care Coordination Outreach Attempts:  An unsuccessful telephone outreach was attempted today to offer the patient information about available care coordination services as a benefit of their health plan.   Follow Up Plan:  Additional outreach attempts will be made to offer the patient care coordination information and services.   Encounter Outcome:  No Answer  Care Coordination Interventions Activated:  No   Care Coordination Interventions:  No, not indicated    [place] [facility] [phone]

## 2022-02-07 ENCOUNTER — Telehealth: Payer: Self-pay

## 2022-02-07 NOTE — Patient Outreach (Unsigned)
  Care Coordination   Initial Visit Note   02/07/2022 Name: Wayne Kaufman MRN: [id number] DOB: Nov 17, 1945  Wayne Kaufman is a 76 y.o. year old male who sees Lawerance Cruel, MD for primary care. I spoke with  Wayne Kaufman by phone today.  What matters to the patients health and wellness today?  No Concerns Expressed          SDOH assessments and interventions completed:  Yes{THN Tip this will not be part of the note when signed-REQUIRED REPORT FIELD DO NOT DELETE (Optional):27901}  SDOH Interventions Today    Flowsheet Row Most Recent Value  SDOH Interventions   Food Insecurity Interventions Intervention Not Indicated  Transportation Interventions Intervention Not Indicated        Care Coordination Interventions Activated:  Yes {THN Tip this will not be part of the note when signed-REQUIRED REPORT FIELD DO NOT DELETE (Optional):27901} Care Coordination Interventions:  Yes, provided {THN Tip this will not be part of the note when signed-REQUIRED REPORT FIELD DO NOT DELETE (Optional):27901}  Follow up plan: No further intervention required.   Encounter Outcome:  Pt. Visit Completed {THN Tip this will not be part of the note when signed-REQUIRED REPORT FIELD DO NOT DELETE (Optional):27901}  [place] [facility] [phone]

## 2022-02-07 NOTE — Patient Instructions (Signed)
Visit Information  Thank you for taking time to speak with me today. Please do not hesitate to call if you require assistance.  Following are the goals we discussed today:        Wayne Kaufman verbalized understanding of the information discussed during the telephonic outreach. Declined need for mailed instructions or resources.  No further follow up required.  [place] [facility] [phone]

## 2022-04-23 ENCOUNTER — Other Ambulatory Visit: Payer: Self-pay | Admitting: Interventional Cardiology

## 2022-06-05 DIAGNOSIS — D6949 Other primary thrombocytopenia: Secondary | ICD-10-CM | POA: Diagnosis not present

## 2022-06-05 DIAGNOSIS — D696 Thrombocytopenia, unspecified: Secondary | ICD-10-CM | POA: Diagnosis not present

## 2022-06-05 DIAGNOSIS — Z0000 Encounter for general adult medical examination without abnormal findings: Secondary | ICD-10-CM | POA: Diagnosis not present

## 2022-06-05 DIAGNOSIS — E786 Lipoprotein deficiency: Secondary | ICD-10-CM | POA: Diagnosis not present

## 2022-06-05 DIAGNOSIS — I10 Essential (primary) hypertension: Secondary | ICD-10-CM | POA: Diagnosis not present

## 2022-07-03 ENCOUNTER — Other Ambulatory Visit: Payer: Self-pay | Admitting: Interventional Cardiology

## 2022-07-08 NOTE — Progress Notes (Unsigned)
Cardiology Office Note   Date:  07/10/2022   ID:  Sumner, Maille [birth year], MRN [id number]  PCP:  Lawerance Cruel, MD    No chief complaint on file.  S/p MV repair  Wt Readings from Last 3 Encounters:  07/10/22 187 lb 3.2 oz (84.9 kg)  05/02/21 191 lb (86.6 kg)  11/24/19 192 lb 12.8 oz (87.5 kg)       History of Present Illness: Wayne Kaufman is a 77 y.o. male  who had mitral valve repair in 03/2009. He has done well. He has no shortness of breath or angina. He did not have significant coronary artery disease at that time.    Intolerance to amoxicillin. He has had flu like sx after his dental cleaning when he takes amoxicillin. These sx resolve by the next day. No rash. No throat swelling.  He switched to clindamycin.   Would not use high dose amlodipine in the future.  He felt some mental slowing in the past on higher dose amlodipine.   Treated for prostate cancer with radiation in the past, no surgery.  He did well. PSA staying low.   He was in [place] and has friends who were exposed to [employer].   His friend Bethanne Ginger, who had been my patient in the past, passed away in 2017-08-26.    Wife with RA.  He had been staying home mostly during [place] in 08-27-2018.   Got his COVID vaccines. In 08-27-19, His friend had a bad experience at the [facility] ER- ultimately diagnosed with opsoclonus-myoclonus.     Goes to dentist 3x/year.   Foot pain in 08/26/20- plantar fasciitis he thinks.  Improved in 08-26-2021.   Stays active with yard work and walking, 30 minutes at a time.    Denies : Chest pain. Dizziness. Leg edema. Nitroglycerin use. Orthopnea. Palpitations. Paroxysmal nocturnal dyspnea. Shortness of breath. Syncope.      Past Medical History:  Diagnosis Date   Broken shoulder 26-Aug-2000   and broken left pelvis   Essential hypertension    Family history of colon cancer    Fractured pelvis ([place]) 08/26/00   fx shoulder and pelvis at the same time   History of echocardiogram    2009/08/26  showed trace mitral regurg, mitral annuloplasty ring noted, EF 50-55%. 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    Allergies:   Amoxicillin and Codeine    Social History:  The patient  reports that he has never smoked. He has never used smokeless tobacco. He reports that he does not drink alcohol and does not use drugs.   Family History:  The patient's family history includes  Cancer in his father and mother; Colon cancer in his father; Congenital heart disease in his sister; Heart failure in his mother.    ROS:  Please see the history of present illness.   Otherwise, review of systems are positive for increased PSA recently- following up with Dr. Alinda Money.   All other systems are reviewed and negative.    PHYSICAL EXAM: VS:  BP (!) 152/76   Pulse 64   Ht 5' 11"$  (1.803 m)   Wt 187 lb 3.2 oz (84.9 kg)   SpO2 97%   BMI 26.11 kg/m  , BMI Body mass index is 26.11 kg/m. GEN: Well nourished, well developed, in no acute distress HEENT: normal Neck: no JVD, carotid bruits, or masses Cardiac: RRR; no murmurs, rubs, or gallops,no edema  Respiratory:  clear to auscultation bilaterally, normal work of breathing GI: soft, nontender, nondistended, + BS MS: no deformity or atrophy Skin: warm and  dry, no rash Neuro:  Strength and sensation are intact Psych: euthymic mood, full affect   EKG:   The ekg ordered today demonstrates normal sinus rhythm, no ST changes   Recent Labs: No results found for requested labs within last 365 days.   Lipid Panel No results found for: "CHOL", "TRIG", "HDL", "CHOLHDL", "VLDL", "LDLCALC", "LDLDIRECT"   Other studies Reviewed: Additional studies/ records that were reviewed today with results demonstrating: LDL 108 in 2024.   ASSESSMENT AND PLAN:  Mitral regurgitation: s/p MV repair.  No CHF.  Valve appears to be functioning well-based on exam needs SBE prophylaxis.  Will refill. Consider echo since it has been nearly 14 years since his surgery.  Will decide what cost is prior to ordering.  Refill clindamycin.  Continue active lifestyle. HTN: Whole food, plant-based diet.  Avoid processed foods.  Home BP readings well controlled. High in the office.  Continue losartan.  Readings tend to be higher in the doctor's office as it was today. Following up with urology for elevated PSA.     Current medicines are reviewed at length with the patient today.  The patient concerns regarding his medicines were addressed.  The following changes have been made:  No change  Labs/ tests ordered today include:  No orders of the defined types were placed in this encounter.   Recommend 150 minutes/week of aerobic exercise Low fat, low carb, high fiber diet recommended  Disposition:   FU in 1 year   Signed, Larae Grooms, MD  07/10/2022 9:13 AM    [facility] [place], [place], [place]  [address] Phone: [phone]; Fax: [phone]

## 2022-07-10 ENCOUNTER — Encounter: Payer: Self-pay | Admitting: Interventional Cardiology

## 2022-07-10 ENCOUNTER — Ambulatory Visit: Payer: Medicare Other | Attending: Interventional Cardiology | Admitting: Interventional Cardiology

## 2022-07-10 VITALS — BP 152/76 | HR 64 | Ht 71.0 in | Wt 187.2 lb

## 2022-07-10 DIAGNOSIS — I10 Essential (primary) hypertension: Secondary | ICD-10-CM

## 2022-07-10 DIAGNOSIS — Z2989 Encounter for other specified prophylactic measures: Secondary | ICD-10-CM | POA: Diagnosis not present

## 2022-07-10 DIAGNOSIS — I059 Rheumatic mitral valve disease, unspecified: Secondary | ICD-10-CM | POA: Diagnosis not present

## 2022-07-10 DIAGNOSIS — R972 Elevated prostate specific antigen [PSA]: Secondary | ICD-10-CM

## 2022-07-10 MED ORDER — CLINDAMYCIN HCL 300 MG PO CAPS: ORAL_CAPSULE | ORAL | 3 refills | Status: DC

## 2022-07-10 NOTE — Patient Instructions (Signed)
Medication Instructions:  Your physician recommends that you continue on your current medications as directed. Please refer to the Current Medication list given to you today.  *If you need a refill on your cardiac medications before your next appointment, please call your pharmacy*  Testing/Procedures: Your physician has requested that you have an echocardiogram. Echocardiography is a painless test that uses sound waves to create images of your heart. It provides your doctor with information about the size and shape of your heart and how well your heart's chambers and valves are working. This procedure takes approximately one hour. There are no restrictions for this procedure. Please do NOT wear cologne, perfume, aftershave, or lotions (deodorant is allowed). Please arrive 15 minutes prior to your appointment time.  Follow-Up: At [facility], you and your health needs are our priority.  As part of our continuing mission to provide you with exceptional heart care, we have created designated Provider Care Teams.  These Care Teams include your primary Cardiologist (physician) and Advanced Practice Providers (APPs -  Physician Assistants and Nurse Practitioners) who all work together to provide you with the care you need, when you need it.  Your next appointment:   1 year(s)  Provider:   Larae Grooms, MD

## 2022-07-19 ENCOUNTER — Other Ambulatory Visit: Payer: Self-pay | Admitting: Interventional Cardiology

## 2022-07-20 DIAGNOSIS — J019 Acute sinusitis, unspecified: Secondary | ICD-10-CM | POA: Diagnosis not present

## 2022-07-20 DIAGNOSIS — R051 Acute cough: Secondary | ICD-10-CM | POA: Diagnosis not present

## 2022-07-28 ENCOUNTER — Telehealth: Payer: Self-pay | Admitting: Interventional Cardiology

## 2022-07-28 NOTE — Telephone Encounter (Signed)
Patient calling to get his echo schd but the order has been cancel. Please advise

## 2022-07-28 NOTE — Telephone Encounter (Signed)
Echo has been scheduled for 08/22/22

## 2022-07-30 ENCOUNTER — Other Ambulatory Visit: Payer: Self-pay | Admitting: Interventional Cardiology

## 2022-08-06 DIAGNOSIS — M545 Low back pain, unspecified: Secondary | ICD-10-CM | POA: Diagnosis not present

## 2022-08-10 DIAGNOSIS — J069 Acute upper respiratory infection, unspecified: Secondary | ICD-10-CM | POA: Diagnosis not present

## 2022-08-10 DIAGNOSIS — Z20822 Contact with and (suspected) exposure to covid-19: Secondary | ICD-10-CM | POA: Diagnosis not present

## 2022-08-19 DIAGNOSIS — Z85828 Personal history of other malignant neoplasm of skin: Secondary | ICD-10-CM | POA: Diagnosis not present

## 2022-08-19 DIAGNOSIS — D485 Neoplasm of uncertain behavior of skin: Secondary | ICD-10-CM | POA: Diagnosis not present

## 2022-08-19 DIAGNOSIS — D1801 Hemangioma of skin and subcutaneous tissue: Secondary | ICD-10-CM | POA: Diagnosis not present

## 2022-08-19 DIAGNOSIS — L821 Other seborrheic keratosis: Secondary | ICD-10-CM | POA: Diagnosis not present

## 2022-08-19 DIAGNOSIS — L814 Other melanin hyperpigmentation: Secondary | ICD-10-CM | POA: Diagnosis not present

## 2022-08-19 DIAGNOSIS — D692 Other nonthrombocytopenic purpura: Secondary | ICD-10-CM | POA: Diagnosis not present

## 2022-08-19 DIAGNOSIS — L570 Actinic keratosis: Secondary | ICD-10-CM | POA: Diagnosis not present

## 2022-08-19 DIAGNOSIS — L820 Inflamed seborrheic keratosis: Secondary | ICD-10-CM | POA: Diagnosis not present

## 2022-08-22 ENCOUNTER — Ambulatory Visit (HOSPITAL_COMMUNITY): Payer: Medicare Other

## 2022-09-11 ENCOUNTER — Ambulatory Visit (HOSPITAL_COMMUNITY): Payer: Medicare Other | Attending: Interventional Cardiology

## 2022-09-11 DIAGNOSIS — Z2989 Encounter for other specified prophylactic measures: Secondary | ICD-10-CM | POA: Diagnosis not present

## 2022-09-11 DIAGNOSIS — I059 Rheumatic mitral valve disease, unspecified: Secondary | ICD-10-CM | POA: Diagnosis not present

## 2022-09-11 DIAGNOSIS — I10 Essential (primary) hypertension: Secondary | ICD-10-CM | POA: Insufficient documentation

## 2022-09-12 LAB — ECHOCARDIOGRAM COMPLETE
Area-P 1/2: 1.66 cm2
MV VTI: 1.12 cm2
S' Lateral: 3.2 cm

## 2023-07-03 ENCOUNTER — Other Ambulatory Visit (HOSPITAL_COMMUNITY): Payer: Self-pay | Admitting: Urology

## 2023-07-03 DIAGNOSIS — C61 Malignant neoplasm of prostate: Secondary | ICD-10-CM

## 2023-07-13 ENCOUNTER — Ambulatory Visit: Payer: Medicare Other | Attending: Cardiovascular Disease | Admitting: Cardiovascular Disease

## 2023-07-13 VITALS — BP 140/74 | HR 71 | Ht 71.0 in | Wt 190.0 lb

## 2023-07-13 DIAGNOSIS — I10 Essential (primary) hypertension: Secondary | ICD-10-CM

## 2023-07-13 DIAGNOSIS — I059 Rheumatic mitral valve disease, unspecified: Secondary | ICD-10-CM | POA: Diagnosis not present

## 2023-07-13 MED ORDER — CLINDAMYCIN HCL 300 MG PO CAPS: ORAL_CAPSULE | ORAL | 2 refills | Status: AC

## 2023-07-13 NOTE — Patient Instructions (Signed)
 Medication Instructions:  Refilled antibiotics *If you need a refill on your cardiac medications before your next appointment, please call your pharmacy*   Lab Work: none If you have labs (blood work) drawn today and your tests are completely normal, you will receive your results only by: MyChart Message (if you have MyChart) OR A paper copy in the mail If you have any lab test that is abnormal or we need to change your treatment, we will call you to review the results.   Testing/Procedures: none   Follow-Up: At [facility], you and your health needs are our priority.  As part of our continuing mission to provide you with exceptional heart care, we have created designated Provider Care Teams.  These Care Teams include your primary Cardiologist (physician) and Advanced Practice Providers (APPs -  Physician Assistants and Nurse Practitioners) who all work together to provide you with the care you need, when you need it.  Your next appointment:   12 month(s)  Provider:   Verne Carrow, MD

## 2023-07-13 NOTE — Progress Notes (Signed)
 Chief Complaint  Patient presents with   Follow-up    Mitral valve disease    History of Present Illness: 78 yo male with history of HTN, prostate cancer, mitral regurgitation s/p mitral valve repair who is here today for follow up. He has been followed in our office by Dr. Eldridge Dace. He had a mitral valve repair in 2010. He had flu like symptoms when using amoxicillin for SBE prophylaxis but has tolerated clindamycin. He has not tolerated high dose of Norvasc due to mental slowing. He has had prostate cancer treated with radiation. Echo April 2024 with LVEF=55-60%. Mitral valve repair working well with mild MS, trivial MR.   He is here today for follow up. The patient denies any chest pain, dyspnea, palpitations, lower extremity edema, orthopnea, PND, dizziness, near syncope or syncope.   Primary Care Physician: Daisy Floro, MD   Past Medical History:  Diagnosis Date   Broken shoulder 2002   and broken left pelvis   Essential hypertension    Family history of colon cancer    Fractured pelvis ([facility]) 2002   fx shoulder and pelvis at the same time   History of echocardiogram    2011 showed trace mitral regurg, mitral annuloplasty ring noted, EF 50-55%. 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 90 tablet 3   Multiple Vitamins-Minerals (AIRBORNE) CHEW 1 tablet Orally once a day     Multiple Vitamins-Minerals (MULTI FOR HIM 50+) TABS as directed Orally     clindamycin (CLEOCIN) 300 MG  capsule Take 2 capsules (600 mg total) 1 hour prior to any dental procedures 6 capsule 2   No current facility-administered medications for this visit.    Allergies  Allergen Reactions   Amoxicillin Other (See Comments)    Flu like symptoms   Codeine Other (See Comments)    unknown    Social History   Socioeconomic History   Marital status: Married    Spouse name: Not on file   Number of children: Not on file   Years of education: Not on file   Highest education level: Not on file  Occupational History   Not on file  Tobacco Use   Smoking status: Never   Smokeless tobacco: Never  Vaping Use   Vaping status: Never Used  Substance and Sexual Activity   Alcohol use: No   Drug use: No   Sexual activity: Yes  Other Topics Concern   Not on file  Social History Narrative   Not on file   Social Drivers of Health   Financial Resource Strain: Not on file  Food Insecurity: No Food Insecurity (02/07/2022)   Hunger Vital Sign    Worried About Running Out of Food in the Last Year: Never true    Ran Out of Food in the Last Year: Never true  Transportation Needs: No Transportation Needs (02/07/2022)  PRAPARE - Administrator, Civil Service (Medical): No    Lack of Transportation (Non-Medical): No  Physical Activity: Not on file  Stress: Not on file  Social Connections: Not on file  Intimate Partner Violence: Not on file    Family History  Problem Relation Age of Onset   Heart failure Mother    Cancer Mother        breast   Colon cancer Father    Cancer Father        colon   Congenital heart disease Sister    Heart attack Neg Hx    Stroke Neg Hx     Review of Systems:  As stated in the HPI and otherwise negative.   BP (!) 140/74   Pulse 71   Ht 5\' 11"  (1.803 m)   Wt 86.2 kg   SpO2 98%   BMI 26.50 kg/m   Physical Examination: General: Well developed, well nourished, NAD  HEENT: OP clear, mucus membranes moist  SKIN: warm, dry. No rashes. Neuro:  No focal deficits  Musculoskeletal: Muscle strength 5/5 all ext  Psychiatric: Mood and affect normal  Neck: No JVD, no carotid bruits, no thyromegaly, no lymphadenopathy.  Lungs:Clear bilaterally, no wheezes, rhonci, crackles Cardiovascular: Regular rate and rhythm. No murmurs, gallops or rubs. Abdomen:Soft. Bowel sounds present. Non-tender.  Extremities: No lower extremity edema. Pulses are 2 + in the bilateral DP/PT.  EKG:  EKG is ordered today. The ekg ordered today demonstrates  EKG Interpretation Date/Time:  Monday July 13 2023 09:50:40 EST Ventricular Rate:  69 PR Interval:  144 QRS Duration:  100 QT Interval:  408 QTC Calculation: 437 R Axis:   36  Text Interpretation: Normal sinus rhythm Nonspecific T wave abnormality Confirmed by Verne Carrow [phone]) on 07/13/2023 10:00:37 AM    Recent Labs: No results found for requested labs within last 365 days.   Lipid Panel No results found for: "CHOL", "TRIG", "HDL", "CHOLHDL", "VLDL", "LDLCALC", "LDLDIRECT"   Wt Readings from Last 3 Encounters:  07/13/23 86.2 kg  07/10/22 84.9 kg  05/02/21 86.6 kg    Assessment and Plan:   1. Mitral valve disease s/p mitral valve repair: Working well by echo in April 2024. Continue SBE prophylaxis when indicated.   2. HTN: BP is well controlled. Continue Norvasc and Losartan.   Labs/ tests ordered today include:   Orders Placed This Encounter  Procedures   EKG 12-Lead    Disposition:   F/U with me in 12 months   Signed, Verne Carrow, MD, [facility] 07/13/2023 10:25 AM    [facility] [address] [place], [place], [place]  [address] Phone: [phone]; Fax: [phone]

## 2023-07-16 ENCOUNTER — Encounter (HOSPITAL_COMMUNITY)
Admission: RE | Admit: 2023-07-16 | Discharge: 2023-07-16 | Disposition: A | Discharge status: Home / Self Care | Payer: Medicare Other | Source: Ambulatory Visit | Attending: Urology | Admitting: Urology

## 2023-07-16 DIAGNOSIS — C61 Malignant neoplasm of prostate: Secondary | ICD-10-CM | POA: Insufficient documentation

## 2023-07-16 MED ORDER — FLOTUFOLASTAT F 18 GALLIUM 296-5846 MBQ/ML IV SOLN
8.0000 | Freq: Once | INTRAVENOUS | Status: AC
  Administered 2023-07-16: 8.53 via INTRAVENOUS
  Filled 2023-07-16: qty 8

## 2023-07-17 ENCOUNTER — Other Ambulatory Visit: Payer: Self-pay | Admitting: Interventional Cardiology

## 2023-07-29 ENCOUNTER — Other Ambulatory Visit: Payer: Self-pay | Admitting: Interventional Cardiology

## 2023-08-19 ENCOUNTER — Telehealth: Payer: Self-pay

## 2023-08-19 NOTE — Telephone Encounter (Signed)
 I called pt to introduce myself as  the Coordinator of the [facility].   1. I confirmed with the patient he is aware of his referral to the clinic 4/22, arriving @ 12:30 pm.    2. I discussed the format of the clinic and the physicians he will be seeing that day.   3. I discussed where the clinic is located and how to contact me.   4. I confirmed his address and informed him I would be mailing a packet of information and forms to be completed. I asked him to bring them with him the day of his appointment.    He voiced understanding of the above. I asked him to call me if he has any questions or concerns regarding his appointments or the forms he needs to complete.

## 2023-09-04 NOTE — Progress Notes (Signed)
 RN spoke with patient to confirm upcoming [facility] appointment on 4/22.

## 2023-09-07 NOTE — Progress Notes (Signed)
 [facility] CONSULT NOTE  Patient Care Team: Jimmey Mould, MD as PCP - General (Family Medicine) Odie Benne, MD as PCP - Cardiology (Cardiology) Katheleen Palmer, RN as Oncology Nurse Navigator  ASSESSMENT & PLAN:  Wayne Kaufman is a 78 y.o.male with history of HTN, mitral valve repair being seen at [facility] for prostate cancer. Now with recurrence.   Initially with unfavorable intermediate risk. GS 4+3=7 diagnosed in 2015 and underwent EBRT in 2016. PSA nadir at 0.22 in Oct 2018 and gradually increased. Most recently 2.23.   Current diagnosis: local recurrence on PET with avid lateral RIGHT lobe of the prostate gland  Initial diagnosis: unfavorable intermediate risk. GS 4+3=7 Treatment: IMRT 07/11/14-09/04/14  His case was discussed at tumor board with multiple specialists including radiation oncologist, urology oncologist, pathologist, radiologist.   Now with PSMA PET showed local recurrence of right lobe of prostate. Report of left rib activity likely of traumatic.  Malignant neoplasm of prostate Recommend proceed with biopsy for diagnosis and radiation for local recurrence. Continue surveillance with Urology.  Patient will follow-up with radiation oncology or urologic oncology for definitive treatment.  He may follow-up with medical oncology as needed.  Supportive calcium (1000-1200 mg daily from food and supplements) and vitamin D3 (1000 IU daily) Healthy lifestyle to prevent diabetes and CV disease Weight-bearing exercises (30 minutes per day) Limit alcohol consumption and avoid smoking  All questions were answered. The patient knows to call the clinic with any problems, questions or concerns. No barriers to learning was detected.  Lowanda Ruddy, MD 4/22/20252:59 PM  CHIEF COMPLAINTS/PURPOSE OF CONSULTATION:  Prostate cancer  HISTORY OF PRESENTING ILLNESS:  Wayne Kaufman 78 y.o. male is here because of prostate cancer.  I have reviewed his chart  and materials related to his cancer extensively and collaborated history with the patient. Summary of oncologic history is as follows:  He was found initially with unfavorable intermediate risk prostate cancer. GS 4+3=7 diagnosed in 2015 and underwent EBRT in 2016. PSA nadir at 0.22 in Oct 2018 and gradually increased. Most recently 2.23.   His PSADT has been slow.    PSMA PET showed local recurrence of right lobe of prostate. Report of left rib activity likely of traumatic.  Oncology History  Malignant neoplasm of prostate (HCC)  02/2014 Initial Diagnosis   Intermediate risk. Malignant neoplasm of prostate (HCC) PSA 2.66.   05/05/2014 Pathology Results   prostate biopsy  Gleason score 4+3 = 7 in 2 cores of the right base  Gleason score 3+4 = 7 in one core.    07/11/2014 - 09/04/2014 Radiation Therapy   definitive radiation - The prostate was treated to 78 Gy in 40 fractions of 1.95 Gy   Oct 2018. PSA nadir 0.22.   04/21/2023 Tumor Marker   PSA 2.12   06/24/2023 Tumor Marker   PSA  2.23   07/16/2023 PET scan   PSMA PET 1. Intense radiotracer activity localizing to the lateral RIGHT lobe of the prostate gland consistent with prostate adenocarcinoma. 2. No evidence of metastatic adenopathy in the pelvis or periaortic retroperitoneum. 3. No evidence of visceral metastasis or pulmonary metastasis. 4. Mild radiotracer activity associated with the anteromedial aspect of the LEFT fourth rib is favored benign.     MEDICAL HISTORY:  Past Medical History:  Diagnosis Date   Broken shoulder 05/19/2000   and broken left pelvis   Elevated PSA    Essential hypertension    Family history of colon cancer  Fractured pelvis ([facility]) 05/19/2000   fx shoulder and pelvis at the same time   History of echocardiogram    2011 showed trace mitral regurg, mitral annuloplasty ring noted, EF 50-55%. 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    SURGICAL HISTORY: Past Surgical History:  Procedure Laterality Date   CARDIAC CATHETERIZATION     COLONOSCOPY  2021   MITRAL VALVE REPAIR  05/19/2008   PROSTATE BIOPSY      SOCIAL HISTORY: Social History   Socioeconomic History   Marital status: Married    Spouse name: Not on file   Number of children: Not on file   Years of education: Not on file   Highest education level: Not on file  Occupational History   Not on file  Tobacco Use   Smoking status: Never   Smokeless tobacco: Never  Vaping Use   Vaping status: Never Used  Substance and Sexual Activity   Alcohol use: No   Drug use: No   Sexual activity: Yes  Other Topics Concern   Not on file  Social History Narrative   Not on file   Social Drivers of Health   Financial Resource Strain: Not on file  Food Insecurity: No Food Insecurity (09/08/2023)   Hunger Vital Sign    Worried About Running Out of Food in the Last Year: Never true    Ran Out of Food in the Last Year: Never true  Transportation Needs: No Transportation Needs (09/08/2023)   PRAPARE - Administrator, Civil Service (Medical): No    Lack of Transportation (Non-Medical): No  Physical Activity: Not on file  Stress: Not on file  Social Connections: Not on file  Intimate Partner Violence: Not At Risk (09/08/2023)   Humiliation, Afraid, Rape, and Kick questionnaire    Fear of Current or Ex-Partner: No    Emotionally Abused: No    Physically Abused: No    Sexually Abused: No    FAMILY HISTORY: Family History  Problem Relation Age of Onset   Heart failure Mother    Cancer Mother        breast   Colon cancer Father    Cancer Father        colon   Congenital heart disease Sister    Heart attack Neg Hx    Stroke Neg Hx     ALLERGIES:  is allergic to amoxicillin and codeine.  MEDICATIONS:  Current Outpatient Medications  Medication Sig Dispense  Refill   amLODipine  (NORVASC ) 5 MG tablet TAKE 1 TABLET BY MOUTH DAILY 90 tablet 3   Apoaequorin (PREVAGEN EXTRA STRENGTH PO) Take by mouth.     clindamycin  (CLEOCIN ) 300 MG capsule Take 2 capsules (600 mg total) 1 hour prior to any dental procedures 6 capsule 2   loratadine (CLARITIN) 10 MG tablet Take 10 mg by mouth daily.     losartan  (COZAAR ) 100 MG tablet TAKE 1 TABLET BY MOUTH DAILY 90 tablet 3   Multiple Vitamins-Minerals (AIRBORNE) CHEW 1 tablet Orally once a day     Multiple Vitamins-Minerals (MULTI FOR HIM 50+) TABS as directed Orally     No current facility-administered medications for this visit.    REVIEW OF SYSTEMS:   All relevant systems were reviewed with the patient and are negative.  PHYSICAL EXAMINATION: ECOG PERFORMANCE STATUS: 0 - Asymptomatic  GENERAL: alert, no distress and comfortable  LABORATORY DATA:  I have reviewed the results of PSA.  RADIOGRAPHIC STUDIES: I have personally reviewed the radiological images as listed and agreed with the findings in the report.

## 2023-09-08 ENCOUNTER — Encounter: Payer: Self-pay | Admitting: Radiation Oncology

## 2023-09-08 ENCOUNTER — Inpatient Hospital Stay

## 2023-09-08 ENCOUNTER — Ambulatory Visit
Admission: RE | Admit: 2023-09-08 | Discharge: 2023-09-08 | Disposition: A | Discharge status: Home / Self Care | Source: Ambulatory Visit | Attending: Radiation Oncology | Admitting: Radiation Oncology

## 2023-09-08 VITALS — BP 157/72 | HR 62 | Temp 97.3°F | Resp 18 | Ht 71.5 in | Wt 192.4 lb

## 2023-09-08 DIAGNOSIS — Z923 Personal history of irradiation: Secondary | ICD-10-CM | POA: Diagnosis not present

## 2023-09-08 DIAGNOSIS — C61 Malignant neoplasm of prostate: Secondary | ICD-10-CM

## 2023-09-08 HISTORY — DX: Elevated prostate specific antigen (PSA): R97.20

## 2023-09-08 NOTE — Assessment & Plan Note (Signed)
 Recommend proceed with biopsy for diagnosis and radiation for local recurrence. Continue surveillance with Urology.

## 2023-09-08 NOTE — Progress Notes (Signed)
 Radiation Oncology         [phone] ________________________________  Multidisciplinary Prostate Cancer Clinic  Initial Radiation Oncology Consultation  Name: Wayne Kaufman MRN: [id number]  Date: 09/08/2023  DOB: [birth year]  WU:XLKG, Laretta Pleasure, MD  Florencio Hunting, MD   REFERRING PHYSICIAN: Florencio Hunting, MD  DIAGNOSIS: 78 y.o. gentleman with a rising PSA of 2.23 s/p definitive EBRT for stage T2a Gleason 3+3 prostate cancer    ICD-10-CM   1. Malignant neoplasm of prostate (HCC)  C61       HISTORY OF PRESENT ILLNESS::Wayne Kaufman is a 78 y.o. gentleman. He was initially referred to Dr. Rozanne Corners back in 03/2014 for a rising PSA and was found to have a palpable prostate nodule. He was subsequently diagnosed with Gleason 4+3 prostate cancer on 05/05/14.      PSA at time of diagnosis had dropped to 2.66. The patient was seen in our multidisciplinary prostate cancer clinic on 05/26/14. He opted to proceed with definitive EBRT as treatment, which he received for 8 weeks from 07/11/14 through 09/04/14. His PSA reached a nadir of 0.22 in 02/2017.  Since that time, his PSA has been slowly climbing, most recently up to 2.23 on 06/24/23. He underwent a staging PSMA PET scan on 07/16/23 showing: intense radiotracer activity localizing to lateral right lobe of prostate; no evidence of metastatic adenopathy, visceral metastasis, or pulmonary metastasis; mild radiotracer activity associated with anteromedial left 4th rib, favored benign.    The patient reviewed the biopsy results with his urologist and he has kindly been referred today to the multidisciplinary prostate cancer clinic for presentation of pathology and radiology studies in our conference for discussion of potential radiation treatment options and clinical evaluation.  PREVIOUS RADIATION THERAPY: Yes  07/11/14 - 09/04/14: The prostate was treated to 78 Gy in 40 fractions of 1.95 Gy   PAST MEDICAL HISTORY:  has a past medical history  of Broken shoulder (05/19/2000), Elevated PSA, Essential hypertension, Family history of colon cancer, Fractured pelvis (HCC) (05/19/2000), History of echocardiogram, Low HDL (under 40), Prostate cancer (HCC), S/P mitral valve repair, and Testicle trouble.    PAST SURGICAL HISTORY: Past Surgical History:  Procedure Laterality Date   CARDIAC CATHETERIZATION     COLONOSCOPY  2021   MITRAL VALVE REPAIR  05/19/2008   PROSTATE BIOPSY      FAMILY HISTORY: family history includes Cancer in his father and mother; Colon cancer in his father; Congenital heart disease in his sister; Heart failure in his mother.  SOCIAL HISTORY:  reports that he has never smoked. He has never used smokeless tobacco. He reports that he does not drink alcohol and does not use drugs.  ALLERGIES: Amoxicillin and Codeine  MEDICATIONS:  Current Outpatient Medications  Medication Sig Dispense Refill   Apoaequorin (PREVAGEN EXTRA STRENGTH PO) Take by mouth.     loratadine (CLARITIN) 10 MG tablet Take 10 mg by mouth daily.     amLODipine  (NORVASC ) 5 MG tablet TAKE 1 TABLET BY MOUTH DAILY 90 tablet 3   clindamycin  (CLEOCIN ) 300 MG capsule Take 2 capsules (600 mg total) 1 hour prior to any dental procedures 6 capsule 2   losartan  (COZAAR ) 100 MG tablet TAKE 1 TABLET BY MOUTH DAILY 90 tablet 3   Multiple Vitamins-Minerals (AIRBORNE) CHEW 1 tablet Orally once a day     Multiple Vitamins-Minerals (MULTI FOR HIM 50+) TABS as directed Orally     No current facility-administered medications for this encounter.      REVIEW  OF SYSTEMS:  On review of systems, the patient reports that he is doing well overall. He denies any chest pain, shortness of breath, cough, fevers, chills, night sweats, unintended weight changes. He denies any bowel disturbances, and denies abdominal pain, nausea or vomiting. He denies any new musculoskeletal or joint aches or pains. His IPSS was Total Score: 3, indicating mild urinary symptoms (Reference 0-7  mild, 8-19 moderate, 20-35 severe).  His SHIM: 21, indicating he has mild erectile dysfunction (Reference - 22-25 None, 17-21 Mild, 8-16 Moderate, 1-7 Severe). A complete review of systems is obtained and is otherwise negative.   PHYSICAL EXAM:  Wt Readings from Last 3 Encounters:  09/08/23 192 lb 6 oz (87.3 kg)  07/13/23 190 lb (86.2 kg)  07/10/22 187 lb 3.2 oz (84.9 kg)   Temp Readings from Last 3 Encounters:  09/08/23 (!) 97.3 F (36.3 C) (Temporal)  08/17/14 98 F (36.7 C) (Oral)  08/10/14 98 F (36.7 C) (Oral)   BP Readings from Last 3 Encounters:  09/08/23 (!) 157/72  07/13/23 (!) 140/74  07/10/22 (!) 152/76   Pulse Readings from Last 3 Encounters:  09/08/23 62  07/13/23 71  07/10/22 64    /10  In general this is a well appearing man in no acute distress. He's alert and oriented x4 and appropriate throughout the examination. Cardiopulmonary assessment is negative for acute distress and he exhibits normal effort.    KPS = 100  100 - Normal; no complaints; no evidence of disease. 90   - Able to carry on normal activity; minor signs or symptoms of disease. 80   - Normal activity with effort; some signs or symptoms of disease. 9   - Cares for self; unable to carry on normal activity or to do active work. 60   - Requires occasional assistance, but is able to care for most of his personal needs. 50   - Requires considerable assistance and frequent medical care. 40   - Disabled; requires special care and assistance. 30   - Severely disabled; hospital admission is indicated although death not imminent. 20   - Very sick; hospital admission necessary; active supportive treatment necessary. 10   - Moribund; fatal processes progressing rapidly. 0     - Dead  Karnofsky DA, Abelmann WH, Craver LS and Burchenal [facility] [phone]) The use of the nitrogen mustards in the palliative treatment of carcinoma: with particular reference to bronchogenic carcinoma Cancer 1 634-56   LABORATORY  DATA:  Lab Results  Component Value Date   WBC 10.0 04/09/2009   HGB 11.3 DELTA CHECK NOTED POST TRANSFUSION SPECIMEN (L) 04/09/2009   HCT 33.0 (L) 04/09/2009   MCV 96.8 04/09/2009   PLT 118 DELTA CHECK NOTED REPEATED TO VERIFY (L) 04/09/2009   Lab Results  Component Value Date   NA 134 (L) 04/09/2009   K 3.9 04/09/2009   CL 100 04/09/2009   CO2 26 04/09/2009   Lab Results  Component Value Date   ALT 16 03/30/2009   AST 21 03/30/2009   ALKPHOS 63 03/30/2009   BILITOT 1.0 03/30/2009     RADIOGRAPHY: No results found.    IMPRESSION/PLAN: 78 y.o. gentleman with a rising PSA of 2.23 s/p definitive EBRT for stage T2a Gleason 3+3 prostate cancer.  PET suggests local only recurrence  Today, we talked to the patient and family about the findings and workup thus far. We discussed the natural history of prostate cancer and general treatment, highlighting the role of radiotherapy in the  management of recurrence.  We discussed the available radiation techniques, and focused on the details and logistics of delivery of SBRT. We reviewed the anticipated acute and late sequelae associated with radiation in this setting. The patient was encouraged to ask questions that were answered to his satisfaction.  At the end of the conversation the patient is interested in moving forward with biopsy of the prostate to confirm PET findings.  He may pursue SBRT for salvage if biopsy confirms local recurrence.   We personally spent 60 minutes in this encounter including chart review, reviewing radiological studies, meeting face-to-face with the patient, entering orders and completing documentation.   ------------------------------------------------   Kenith Payer, MD [facility]  Radiation Oncology Direct Dial: [phone]  Fax: [phone] [email or web address]  Skype  [email or web address]   This document serves as a record of services personally performed by Kenith Payer, MD. It was created on his behalf  by Florance Hun, a trained medical scribe. The creation of this record is based on the scribe's personal observations and the provider's statements to them. This document has been checked and approved by the attending provider.

## 2023-09-08 NOTE — Progress Notes (Signed)
                               Care Plan Summary  Name: Wayne Kaufman DOB: 09/23/1945   Your Medical Team:   Urologist -  Dr. Florencio Hunting, [facility]  Radiation Oncologist - Dr. Kenith Payer, [facility]   Medical Oncologist - Dr. Janeann Mean, [facility]  Recommendations: -Repeat prostate biopsy  -SBRT (Stereotactic Body Radiation Therapy)  * These recommendations are based on information available as of today's consult.      Recommendations may change depending on the results of further tests or exams.    Next Steps: 1) [facility] will contact you with prostate biopsy date.  2) After biopsy confirmation, [facility] will set you up for a CT Simulation. 3) Radiation will begin in 7-10 days after simulation.   When appointments need to be scheduled, you will be contacted by [facility] and/or [facility].  Questions?  Please do not hesitate to call Katheleen Palmer, BSN, RN at [phone] with any questions or concerns.  [name] is your Oncology Nurse Navigator and is available to assist you while you're receiving your medical care at [facility].

## 2023-09-08 NOTE — Consult Note (Signed)
 Multi-Disciplinary Clinic     09/08/2023   --------------------------------------------------------------------------------   Wayne Kaufman  MRN: [id number]  DOB: [birth year], 78 year old Male  SSN: [id number]   PRIMARY CARE:  Ernestina Headland L. Jeanette Milks, MD  PRIMARY CARE FAX:  [phone]  REFERRING:  Dwan Girt  PROVIDER:  Florencio Hunting, M.D.  LOCATION:  [facility] [phone]     --------------------------------------------------------------------------------   CC/HPI: CC: Prostate Cancer   PCP: Dr. Cherly Corners  Location of consult: [facility] - Prostate Cancer Multidisciplinary Clinic   Wayne Kaufman is a 78 year old gentleman with a history of hypertension but is otherwise healthy. He was noted to have a small prostate nodule and a PSA of 2.66. He underwent a biopsy December 2015 and was found to have Gleason 4+3=7 adenocarcinoma with 3 out of 12 biopsy cores positive for malignancy. He is s/p treatment with IMRT (78 Gy) from February through April 2016 under the care of Dr. Lorri Rota. His PSA reached a nadir or 0.22 in October 2018. His PSA began slowly increasing in August of 2023 and reached 2.23 in February of 2025 indicating concern for biochemical recurrence. He underwent a PSMA PET scan on 07/16/23 that demonstrated intense radiotracer uptake in the right lobe of the prostate. There was mild activity in the left 4th rib and adjacent ribs that are favored to be benign but otherwise no evidence to suggest metastatic disease. He did have a serious trauma in 2002 when he fell from a tree and landing on his left side. He presents to discuss options for management.     ALLERGIES: Amoxicillin TABS - Other Reaction, "fluish" Codeine Derivatives - Other Reaction, [place]    MEDICATIONS: Tadalafil 20 MG Tablet 1 tablet PO prn as directed  amLODIPine  Besylate 2.5 MG Tablet Oral  Losartan  Potassium 100 MG Tablet Oral     GU PSH: Locm 300-399Mg /Ml  Iodine,1Ml - 2019     NON-GU PSH: Colonoscopy - 2018, about 2018 Repair Mitral Valve - about 2010 Visit Complexity (formerly GPC1X) - 07/01/2023     GU PMH: Prostate Cancer - 07/01/2023, - 07/11/2022, - 2023, - 2022, Prostate cancer, - 2017 Gross hematuria (Acute), Will proceed with hematuria workup with CT and possible cysto w/Dr. Rozanne Corners - 2019 ED due to arterial insufficiency, Erectile dysfunction due to arterial insufficiency - 2017      PMH Notes:   1) Prostate cancer: He is s/p treatment with IMRT (78 Gy) from February through April 2016 under the care of Dr. Lorri Rota for clinical stage T2a Nx Mx, Gleason 4+3=7 adenocarcinoma of the prostate with a pretreatment PSA of 2.66.   PSA nadir after treatment: 0.22 ( Oct 2018)  Pretreatment IPSS: 5  Pretreatment SHIM: 22   2) Hematuria: He developed hematuria and hematochezia in November 2018.   Jan 2019: CT imaging - unremarkable      NON-GU PMH: Fracture of left shoulder girdle, part unspecified, initial encounter for closed fracture, Shoulder fracture, left - 2015 Personal history of (healed) traumatic fracture, History of fracture of pelvis - 2015 Hypertension    FAMILY HISTORY: Colon Cancer - Runs In Family Congenital Heart Disease - Runs In Family Congestive Heart Failure - Runs In Family   SOCIAL HISTORY: Marital Status: Married Preferred Language: English; Ethnicity: Not Hispanic Or Latino; Race: White Current Smoking Status: Patient has never smoked.   Tobacco Use Assessment Completed: Used Tobacco in last 30 days? Does not use smokeless tobacco.  Has never drank.  Does not use drugs. Drinks 4+ caffeinated drinks per day.    REVIEW OF SYSTEMS:    GU Review Male:   Patient denies frequent urination, hard to postpone urination, burning/ pain with urination, get up at night to urinate, leakage of urine, stream starts and stops, trouble starting your streams, and have to strain to urinate .  Gastrointestinal (Upper):    Patient denies nausea and vomiting.  Gastrointestinal (Lower):   Patient denies diarrhea and constipation.  Constitutional:   Patient denies fever, night sweats, weight loss, and fatigue.  Skin:   Patient denies skin rash/ lesion and itching.  Eyes:   Patient denies blurred vision and double vision.  Ears/ Nose/ Throat:   Patient denies sore throat and sinus problems.  Hematologic/Lymphatic:   Patient denies swollen glands and easy bruising.  Cardiovascular:   Patient denies leg swelling and chest pains.  Respiratory:   Patient denies cough and shortness of breath.  Endocrine:   Patient denies excessive thirst.  Musculoskeletal:   Patient denies back pain and joint pain.  Neurological:   Patient denies headaches and dizziness.  Psychologic:   Patient denies depression and anxiety.   VITAL SIGNS: None   MULTI-SYSTEM PHYSICAL EXAMINATION:    Constitutional: Well-nourished. No physical deformities. Normally developed. Good grooming.     Complexity of Data:  Lab Test Review:   PSA  Records Review:   Previous Patient Records  X-Ray Review: PET- PSMA Scan: Reviewed Films.     06/24/23 04/20/23 01/09/23 07/04/22 01/06/22 06/26/21 11/23/20 11/23/19  PSA  Total PSA 2.23 ng/mL 2.12 ng/mL 1.98 ng/mL 1.40 ng/mL 1.10 ng/mL 0.82 ng/mL 0.90 ng/mL 0.47 ng/mL    PROCEDURES:          Visit Complexity - G2211    ASSESSMENT:      ICD-10 Details  1 GU:   Prostate Cancer - C61    PLAN:            Medications New Meds: levoFLOXacin 750 MG Tablet Take 1 table the morning prior to your biopsy   #1  0 Refill(s)  Pharmacy Name:  [facility] [id number]  Address:  [address] [place], [place] [address]  Phone:  [phone]  Fax:  [phone]            Schedule Return Visit/Planned Activity: Next Available Appointment - TRUSP          Document Letter(s):  Created for Patient: Clinical Summary   Created for Patient: Clinical Summary         Notes:   1. Prostate  cancer: I had a detailed discussion with Wayne Kaufman and his wife today his biochemically recurrent prostate cancer. We reviewed his PSMA PET scan results which suggest locally recurrent cancer. We discussed options including continued observation with systemic therapy in the future as needed for considering salvage local curative therapy such as ablation or repeat radiation such as SBRT. I did not recommend surgical therapy considering his advanced age and prior radiation therapy considering. At this time, he appears inclined to proceed with salvage local therapy and appears to be leaning toward SBRT but would like to discuss this with Dr. Lorri Rota further. He tentatively will be scheduled for a prostate biopsy as a next step to make a definitive diagnosis for locally recurrent disease and to assess his volume and grade of disease.   CC: Dr. Cherly Corners  Dr. Janeann Mean  Dr. Kenith Payer  Next Appointment:      Next Appointment: 10/07/2023 11:15 AM    Appointment Type: Laboratory Appointment    Location: [facility] [phone]    Provider: Lab LAB    Reason for Visit: 3 mnths psa      E & M CODES: We spent 33 minutes dedicated to evaluation and management time, including face to face interaction, discussions on coordination of care, documentation, result review, and discussion with others as applicable.

## 2023-09-08 NOTE — Addendum Note (Signed)
 Encounter addended by: Florencio Hunting, MD on: 09/08/2023 5:26 PM  Actions taken: Clinical Note Signed

## 2023-09-09 ENCOUNTER — Encounter: Payer: Self-pay | Admitting: Genetic Counselor

## 2023-09-09 ENCOUNTER — Telehealth: Payer: Self-pay | Admitting: Genetic Counselor

## 2023-09-09 NOTE — Telephone Encounter (Signed)
 Wayne Kaufman was seen by a genetic counselor during the prostate multidisciplinary clinic on 09/08/2023. In addition to his personal history of prostate cancer, he reported a family history of a father diagnosed with colon cancer at age 79 and mother diagnosed with breast cancer at age 15. He does not meet NCCN criteria for genetic testing at this time. He was still offered genetic counseling and testing but declined. We encourage him to contact us  if there are any changes to his personal or family history of cancer. If he meets NCCN criteria based on the updated personal/family history, he would be recommended to have genetic counseling and testing.   Wayne Donaghue, MS, [facility] Genetic Counselor [email or web address] (P) [phone]

## 2023-09-18 NOTE — Progress Notes (Signed)
 Patient proceed with repeat prostate biopsy with Dr. Rozanne Corners with pathology showing 3 cores (all on the right) positive for Gleason 3+4=7.   Patient aware and agreeable to proceed with the 5 fx's of SBRT.  Patient will be set up for CT Simulation.   Plan of care in progress.

## 2023-09-23 NOTE — Progress Notes (Signed)
  Radiation Oncology         [phone] ________________________________  Name: Wayne Kaufman MRN: [id number]  Date: 09/24/2023  DOB: Jul 24, 1945  STEREOTACTIC BODY RADIOTHERAPY SIMULATION AND TREATMENT PLANNING NOTE    ICD-10-CM   1. Malignant neoplasm of prostate (HCC)  C61       DIAGNOSIS:  78 y.o. gentleman with a rising PSA of 2.23 and biopsy confirmed local recurrence in the prostate as seen on restaging PSMA PET, s/p definitive EBRT for stage T2a Gleason 3+3 prostate cancer   NARRATIVE:  The patient was brought to the CT Simulation planning suite.  Identity was confirmed.  All relevant records and images related to the planned course of therapy were reviewed.  The patient freely provided informed written consent to proceed with treatment after reviewing the details related to the planned course of therapy. The consent form was witnessed and verified by the simulation staff.  Then, the patient was set-up in a stable reproducible  supine position for radiation therapy.  A BodyFix immobilization pillow was fabricated for reproducible positioning.  Surface markings were placed.  The CT images were loaded into the planning software.  The gross target volumes (GTV) and planning target volumes (PTV) were delinieated, and avoidance structures were contoured.  Treatment planning then occurred.  The radiation prescription was entered and confirmed.  A total of two complex treatment devices were fabricated in the form of the BodyFix immobilization pillow and a neck accuform cushion.  I have requested : 3D Simulation  I have requested a DVH of the following structures: targets and all normal structures near the target including rectum, bladder, femoral heads, urethra and others as noted on the radiation plan to maintain doses in adherence with established limits  SPECIAL TREATMENT PROCEDURE:  The planned course of therapy using radiation constitutes a special treatment procedure. Special care is required  in the management of this patient for the following reasons. High dose per fraction requiring special monitoring for increased toxicities of treatment including daily imaging..  The special nature of the planned course of radiotherapy will require increased physician supervision and oversight to ensure patient's safety with optimal treatment outcomes.    This requires extended time and effort.    PLAN:  The patient will receive 36.25 Gy in 5 fractions.  ________________________________  Trilby Fujisawa Lorri Rota, M.D.

## 2023-09-24 ENCOUNTER — Ambulatory Visit
Admission: RE | Admit: 2023-09-24 | Discharge: 2023-09-24 | Disposition: A | Discharge status: Home / Self Care | Source: Ambulatory Visit | Attending: Radiation Oncology | Admitting: Radiation Oncology

## 2023-09-24 DIAGNOSIS — C61 Malignant neoplasm of prostate: Secondary | ICD-10-CM | POA: Insufficient documentation

## 2023-09-24 DIAGNOSIS — Z510 Encounter for antineoplastic radiation therapy: Secondary | ICD-10-CM | POA: Diagnosis present

## 2023-09-29 DIAGNOSIS — Z510 Encounter for antineoplastic radiation therapy: Secondary | ICD-10-CM | POA: Diagnosis not present

## 2023-10-02 NOTE — Progress Notes (Signed)
 RN left message for patient for call back prior to upcoming SBRT.

## 2023-10-05 ENCOUNTER — Ambulatory Visit
Admission: RE | Admit: 2023-10-05 | Discharge: 2023-10-05 | Disposition: A | Discharge status: Home / Self Care | Source: Ambulatory Visit | Attending: Radiation Oncology | Admitting: Radiation Oncology

## 2023-10-05 ENCOUNTER — Other Ambulatory Visit: Payer: Self-pay

## 2023-10-05 DIAGNOSIS — Z510 Encounter for antineoplastic radiation therapy: Secondary | ICD-10-CM | POA: Diagnosis not present

## 2023-10-05 DIAGNOSIS — C61 Malignant neoplasm of prostate: Secondary | ICD-10-CM

## 2023-10-05 LAB — RAD ONC ARIA SESSION SUMMARY
Course Elapsed Days: 0
Plan Fractions Treated to Date: 1
Plan Prescribed Dose Per Fraction: 7.25 Gy
Plan Total Fractions Prescribed: 5
Plan Total Prescribed Dose: 36.25 Gy
Reference Point Dosage Given to Date: 7.25 Gy
Reference Point Session Dosage Given: 7.25 Gy
Session Number: 1

## 2023-10-06 ENCOUNTER — Ambulatory Visit: Admitting: Radiation Oncology

## 2023-10-07 ENCOUNTER — Ambulatory Visit
Admission: RE | Admit: 2023-10-07 | Discharge: 2023-10-07 | Disposition: A | Discharge status: Home / Self Care | Source: Ambulatory Visit | Attending: Radiation Oncology | Admitting: Radiation Oncology

## 2023-10-07 ENCOUNTER — Other Ambulatory Visit: Payer: Self-pay

## 2023-10-07 DIAGNOSIS — C61 Malignant neoplasm of prostate: Secondary | ICD-10-CM

## 2023-10-07 DIAGNOSIS — Z510 Encounter for antineoplastic radiation therapy: Secondary | ICD-10-CM | POA: Diagnosis not present

## 2023-10-07 LAB — RAD ONC ARIA SESSION SUMMARY
Course Elapsed Days: 2
Plan Fractions Treated to Date: 2
Plan Prescribed Dose Per Fraction: 7.25 Gy
Plan Total Fractions Prescribed: 5
Plan Total Prescribed Dose: 36.25 Gy
Reference Point Dosage Given to Date: 14.5 Gy
Reference Point Session Dosage Given: 7.25 Gy
Session Number: 2

## 2023-10-08 ENCOUNTER — Ambulatory Visit: Admitting: Radiation Oncology

## 2023-10-09 ENCOUNTER — Other Ambulatory Visit: Payer: Self-pay

## 2023-10-09 ENCOUNTER — Ambulatory Visit
Admission: RE | Admit: 2023-10-09 | Discharge: 2023-10-09 | Disposition: A | Discharge status: Home / Self Care | Source: Ambulatory Visit | Attending: Radiation Oncology | Admitting: Radiation Oncology

## 2023-10-09 DIAGNOSIS — C61 Malignant neoplasm of prostate: Secondary | ICD-10-CM

## 2023-10-09 DIAGNOSIS — Z510 Encounter for antineoplastic radiation therapy: Secondary | ICD-10-CM | POA: Diagnosis not present

## 2023-10-09 LAB — RAD ONC ARIA SESSION SUMMARY
Course Elapsed Days: 4
Plan Fractions Treated to Date: 3
Plan Prescribed Dose Per Fraction: 7.25 Gy
Plan Total Fractions Prescribed: 5
Plan Total Prescribed Dose: 36.25 Gy
Reference Point Dosage Given to Date: 21.75 Gy
Reference Point Session Dosage Given: 7.25 Gy
Session Number: 3

## 2023-10-13 ENCOUNTER — Ambulatory Visit
Admission: RE | Admit: 2023-10-13 | Discharge: 2023-10-13 | Disposition: A | Discharge status: Home / Self Care | Source: Ambulatory Visit | Attending: Radiation Oncology | Admitting: Radiation Oncology

## 2023-10-13 ENCOUNTER — Other Ambulatory Visit: Payer: Self-pay

## 2023-10-13 DIAGNOSIS — Z510 Encounter for antineoplastic radiation therapy: Secondary | ICD-10-CM | POA: Diagnosis not present

## 2023-10-13 DIAGNOSIS — C61 Malignant neoplasm of prostate: Secondary | ICD-10-CM

## 2023-10-13 LAB — RAD ONC ARIA SESSION SUMMARY
Course Elapsed Days: 8
Plan Fractions Treated to Date: 4
Plan Prescribed Dose Per Fraction: 7.25 Gy
Plan Total Fractions Prescribed: 5
Plan Total Prescribed Dose: 36.25 Gy
Reference Point Dosage Given to Date: 29 Gy
Reference Point Session Dosage Given: 7.25 Gy
Session Number: 4

## 2023-10-15 ENCOUNTER — Ambulatory Visit
Admission: RE | Admit: 2023-10-15 | Discharge: 2023-10-15 | Disposition: A | Discharge status: Home / Self Care | Source: Ambulatory Visit | Attending: Radiation Oncology | Admitting: Radiation Oncology

## 2023-10-15 ENCOUNTER — Other Ambulatory Visit: Payer: Self-pay

## 2023-10-15 DIAGNOSIS — C61 Malignant neoplasm of prostate: Secondary | ICD-10-CM

## 2023-10-15 DIAGNOSIS — Z510 Encounter for antineoplastic radiation therapy: Secondary | ICD-10-CM | POA: Diagnosis not present

## 2023-10-15 LAB — RAD ONC ARIA SESSION SUMMARY
Course Elapsed Days: 10
Plan Fractions Treated to Date: 5
Plan Prescribed Dose Per Fraction: 7.25 Gy
Plan Total Fractions Prescribed: 5
Plan Total Prescribed Dose: 36.25 Gy
Reference Point Dosage Given to Date: 36.25 Gy
Reference Point Session Dosage Given: 7.25 Gy
Session Number: 5

## 2023-10-16 NOTE — Progress Notes (Signed)
 RN left message for call back to review next steps after completing recent SBRT on 5/29.

## 2023-10-16 NOTE — Radiation Completion Notes (Addendum)
  Radiation Oncology         [phone] ________________________________  Name: Wayne Kaufman MRN: [id number]  Date: 10/15/2023  DOB: 06/01/1945  Referring Physician: Kristeen Peto, M.D. Date of Service: 2023-10-16 Radiation Oncologist: Bartholome Ligas, M.D. [facility]     RADIATION ONCOLOGY END OF TREATMENT NOTE     Diagnosis: 78 y.o. gentleman with a rising PSA of 2.23 s/p definitive EBRT for stage T2a Gleason 3+3 prostate cancer   Intent: Curative     ==========DELIVERED PLANS==========  First Treatment Date: 2023-10-05 Last Treatment Date: 2023-10-15   Plan Name: Prostate_SBRT Site: Prostate Technique: SBRT/SRT-IMRT Mode: Photon Dose Per Fraction: 7.25 Gy Prescribed Dose (Delivered / Prescribed): 36.25 Gy / 36.25 Gy Prescribed Fxs (Delivered / Prescribed): 5 / 5     ==========ON TREATMENT VISIT DATES========== 2023-10-05, 2023-10-07, 2023-10-09, 2023-10-13, 2023-10-15, 2023-10-15    See weekly On Treatment Notes in Epic for details in the Media tab (listed as Progress notes on the On Treatment Visit Dates listed above).  He tolerated the treatments well with only modest fatigue.  The patient will receive a call in about one month from the radiation oncology department. He will continue follow up with his urologist, Dr. Rozanne Corners, as well.  ------------------------------------------------   Kenith Payer, MD [facility]  Radiation Oncology Direct Dial: [phone]  Fax: [phone] .com  Skype  LinkedIn

## 2023-10-30 ENCOUNTER — Other Ambulatory Visit: Payer: Self-pay | Admitting: Urology

## 2023-10-30 DIAGNOSIS — C61 Malignant neoplasm of prostate: Secondary | ICD-10-CM

## 2023-11-10 ENCOUNTER — Ambulatory Visit
Admission: RE | Admit: 2023-11-10 | Discharge: 2023-11-10 | Disposition: A | Discharge status: Home / Self Care | Source: Ambulatory Visit

## 2023-11-10 DIAGNOSIS — Z510 Encounter for antineoplastic radiation therapy: Secondary | ICD-10-CM | POA: Insufficient documentation

## 2023-11-10 DIAGNOSIS — C61 Malignant neoplasm of prostate: Secondary | ICD-10-CM | POA: Insufficient documentation

## 2023-11-10 NOTE — Progress Notes (Addendum)
  Radiation Oncology         [phone] ________________________________  Name: Wayne Kaufman MRN: [id number]  Date of Service: 11/10/2023  DOB: [birth year]  Post Treatment Telephone Note  Diagnosis: 78 y.o. gentleman with a rising PSA of 2.23 s/p definitive EBRT for stage T2a Gleason 3+3 prostate cancer   Pre Treatment IPSS Score: 3 (as documented in the provider consult note)  The patient was available for call today.   Symptoms of fatigue have improved since completing therapy.  Symptoms of bladder changes have improved since completing therapy. Current symptoms include none, and medications for bladder symptoms include none.  Symptoms of bowel changes have improved since completing therapy. Current symptoms include none, and medications for bowel symptoms include none.   Post Treatment IPSS Score: IPSS Questionnaire (AUA-7): Over the past month.   1)  How often have you had a sensation of not emptying your bladder completely after you finish urinating?  0 - Not at all  2)  How often have you had to urinate again less than two hours after you finished urinating? 0 - Not at all  3)  How often have you found you stopped and started again several times when you urinated?  0 - Not at all  4) How difficult have you found it to postpone urination?  0 - Not at all  5) How often have you had a weak urinary stream?  0 - Not at all  6) How often have you had to push or strain to begin urination?  0 - Not at all  7) How many times did you most typically get up to urinate from the time you went to bed until the time you got up in the morning?  1 - 1 time  Total score:  1. Which indicates mild symptoms  0-7 mildly symptomatic   8-19 moderately symptomatic   20-35 severely symptomatic   Vague answers given by patient during this call....  Patient has a scheduled follow up visit with his urologist, Dr. Renda, on 12/2023 for ongoing surveillance. He was counseled that PSA levels will be  drawn in the urology office, and was reassured that additional time is expected to improve bowel and bladder symptoms. He was encouraged to call back with concerns or questions regarding radiation.   This concludes the interaction.  Rosaline Minerva, LPN

## 2023-12-03 ENCOUNTER — Encounter: Payer: Self-pay | Admitting: *Deleted

## 2023-12-10 NOTE — Progress Notes (Signed)
 RN left message for call back to assess any questions after completion of SBRT.   Patient had PSA on 12/09/23 and will follow up with urologist on 12/16/23.

## 2023-12-11 ENCOUNTER — Encounter: Payer: Self-pay | Admitting: *Deleted

## 2023-12-23 ENCOUNTER — Encounter: Payer: Self-pay | Admitting: Urology

## 2024-09-08 ENCOUNTER — Ambulatory Visit: Admitting: Cardiovascular Disease

## 2024-09-13 ENCOUNTER — Ambulatory Visit: Admitting: Cardiovascular Disease
